# Patient Record
Sex: Female | Born: 1995 | Race: White | Hispanic: No | Marital: Single | Smoking: Never smoker
Health system: Southern US, Community
[De-identification: ages and names within clinical notes are randomized; demographics above are authoritative.]

## PROBLEM LIST (undated history)

## (undated) DIAGNOSIS — G43909 Migraine, unspecified, not intractable, without status migrainosus: Secondary | ICD-10-CM

## (undated) HISTORY — DX: Migraine, unspecified, not intractable, without status migrainosus: G43.909

## (undated) HISTORY — PX: WISDOM TOOTH EXTRACTION: SHX21

---

## 2017-06-30 HISTORY — PX: LUMBAR PUNCTURE: SHX1985

## 2017-12-24 ENCOUNTER — Encounter: Payer: Self-pay | Admitting: Neurology

## 2017-12-24 ENCOUNTER — Ambulatory Visit (INDEPENDENT_AMBULATORY_CARE_PROVIDER_SITE_OTHER): Payer: Managed Care, Other (non HMO) | Admitting: Neurology

## 2017-12-24 ENCOUNTER — Other Ambulatory Visit: Payer: Self-pay | Admitting: Neurology

## 2017-12-24 VITALS — BP 120/80 | HR 70 | Temp 98.5°F | Resp 12

## 2017-12-24 VITALS — BP 113/75 | HR 84 | Ht 61.0 in | Wt 144.0 lb

## 2017-12-24 DIAGNOSIS — H532 Diplopia: Secondary | ICD-10-CM | POA: Diagnosis not present

## 2017-12-24 DIAGNOSIS — R51 Headache with orthostatic component, not elsewhere classified: Secondary | ICD-10-CM

## 2017-12-24 DIAGNOSIS — R519 Headache, unspecified: Secondary | ICD-10-CM

## 2017-12-24 DIAGNOSIS — G43009 Migraine without aura, not intractable, without status migrainosus: Secondary | ICD-10-CM | POA: Insufficient documentation

## 2017-12-24 DIAGNOSIS — H4710 Unspecified papilledema: Secondary | ICD-10-CM

## 2017-12-24 DIAGNOSIS — H5462 Unqualified visual loss, left eye, normal vision right eye: Secondary | ICD-10-CM

## 2017-12-24 DIAGNOSIS — H9190 Unspecified hearing loss, unspecified ear: Secondary | ICD-10-CM

## 2017-12-24 DIAGNOSIS — G8929 Other chronic pain: Secondary | ICD-10-CM

## 2017-12-24 DIAGNOSIS — G43711 Chronic migraine without aura, intractable, with status migrainosus: Secondary | ICD-10-CM | POA: Insufficient documentation

## 2017-12-24 DIAGNOSIS — G43709 Chronic migraine without aura, not intractable, without status migrainosus: Secondary | ICD-10-CM

## 2017-12-24 MED ORDER — ERENUMAB-AOOE 140 MG/ML ~~LOC~~ SOAJ: 140.0000 mg | SUBCUTANEOUS | 11 refills | Status: DC

## 2017-12-24 NOTE — Progress Notes (Signed)
[facility]    Provider:  Dr Lucia Gaskins Referring Provider: Lynden Ang, NP Primary Care Physician:  Lynden Ang, NP   CC:  Intractable headaches  HPI:  Andrea Dunlap is a 22 y.o. female here as a referral from Dr. Maurice March for intractable headaches. She has a PMHx of migraines. Started in the 6th grade. She has pain at the base of her neck unilaterally, light sensitivity and smell sensitivity, they spread unilaterally and cal also spread bilaterally, she feels fatigued and confused, dizzy, nausea, pressure, pounding, throbbing. Movement makes it worse. Can last 5-6 days. Daily headaches. She has 15 migraines days a month. Most are moderately severe and can have several days of severe, needs to sleep and be in a dark room, she can't work. Sleep helps. Ongoing for several years at this severity and frequency. Birth Control helped. Caffeine and ibuprofen helps.  Failed Topamax, Celebrex, Compazine, nortriptyline,. Topamax helped but had side effects to it. No medication overuse. No aura.  Patient has blurry vision especially in the left eye, she has eye pain on the left and eye pain on movement, she reports diplopia.  Headaches can be positionally worse, worse bending over, she can have migraine headaches in the morning, episodes of blurry vision, also pulsating and hearing changes in the ears. No other focal neurologic deficits, associated symptoms, inciting events or modifiable factors.  Meds: Failed Topamax, Celebrex, Compazine, nortriptyline,. Topamax helped but had side effects to it.   Reviewed notes, labs and imaging from outside physicians, which showed:  Reviewed notes from referring physician.  Patient has trouble remembering to take her birth control and so she is getting an implant.  She has regular menstrual cycles.  She is been having migraines and worsening headaches over the last several years she reports this is affecting her vision which can get blurry birth  control pills have helped but she still continues to have daily headaches she has seen a neurologist for this in the past needs a new one.  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  Physical exam was normal.  She was referred here for evaluation of these headaches.  No significant caffeine use, no drug use, no smoking.  Migraines have been worse with her menstruation.  She is seeing [facility] in the past.  Review of Systems: Patient complains of symptoms per HPI as well as the following symptoms: headaches. Pertinent negatives and positives per HPI. All others negative.   Social History   Socioeconomic History  . Marital status: Single    Spouse name: Not on file  . Number of children: Not on file  . Years of education: Not on file  . Highest education level: Not on file  Occupational History  . Not on file  Social Needs  . Financial resource strain: Not on file  . Food insecurity:    Worry: Not on file    Inability: Not on file  . Transportation needs:    Medical: Not on file    Non-medical: Not on file  Tobacco Use  . Smoking status: Never Smoker  . Smokeless tobacco: Never Used  Substance and Sexual Activity  . Alcohol use: Never    Frequency: Never  . Drug use: Not on file  . Sexual activity: Not on file  Lifestyle  . Physical activity:    Days per week: Not on file    Minutes per session: Not on file  . Stress: Not on file  Relationships  . Social  connections:    Talks on phone: Not on file    Gets together: Not on file    Attends religious service: Not on file    Active member of club or organization: Not on file    Attends meetings of clubs or organizations: Not on file    Relationship status: Not on file  . Intimate partner violence:    Fear of current or ex partner: Not on file    Emotionally abused: Not on file    Physically abused: Not on file    Forced sexual activity: Not on file  Other Topics Concern  . Not on file  Social History  Narrative   Averages 3 cups of caffeine daily.    Family History  Problem Relation Age of Onset  . Migraines Mother   . Headache Brother     Past Medical History:  Diagnosis Date  . Migraines     Past Surgical History:  Procedure Laterality Date  . WISDOM TOOTH EXTRACTION      Current Outpatient Medications  Medication Sig Dispense Refill  . Erenumab-aooe (AIMOVIG) 140 MG/ML SOAJ Inject 140 mg into the skin every 30 (thirty) days. 1 pen 11  . norethindrone (MICRONOR,CAMILA,ERRIN) 0.35 MG tablet Take 1 tablet by mouth daily.     No current facility-administered medications for this visit.     Allergies as of 12/24/2017 - Review Complete 12/24/2017  Allergen Reaction Noted  . Nickel  12/24/2017    Vitals: There were no vitals taken for this visit. Last Weight:  Wt Readings from Last 1 Encounters:  12/24/17 144 lb (65.3 kg) (74 %, Z= 0.63)*   * Growth percentiles are based on CDC (Girls, 2-20 Years) data.   Last Height:   Ht Readings from Last 1 Encounters:  12/24/17 5\' 1"  (1.549 m) (10 %, Z= -1.29)*   * Growth percentiles are based on CDC (Girls, 2-20 Years) data.   Physical exam: Exam: Gen: NAD, conversant, well nourised, well groomed                     CV: RRR, no MRG. No Carotid Bruits. No peripheral edema, warm, nontender Eyes: Conjunctivae clear without exudates or hemorrhage  Neuro: Detailed Neurologic Exam  Speech:    Speech is normal; fluent and spontaneous with normal comprehension.  Cognition:    The patient is oriented to person, place, and time;     recent and remote memory intact;     language fluent;     normal attention, concentration,     fund of knowledge Cranial Nerves:    The pupils are equal, round, and reactive to light.  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  Visual fields are full to finger confrontation. Extraocular movements are intact. Trigeminal sensation is intact and the muscles of mastication are  normal. The face is symmetric. The palate elevates in the midline. Hearing intact. Voice is normal. Shoulder shrug is normal. The tongue has normal motion without fasciculations.   Coordination:    Normal finger to nose and heel to shin. Normal rapid alternating movements.   Gait:    Heel-toe and tandem gait are normal.   Motor Observation:    No asymmetry, no atrophy, and no involuntary movements noted. Tone:    Normal muscle tone.    Posture:    Posture is normal. normal erect    Strength:    Strength is V/V in the upper and lower limbs.  Sensation: intact to LT     Reflex Exam:  DTR's:    Deep tendon reflexes in the upper and lower extremities are normal bilaterally.   Toes:    The toes are downgoing bilaterally.   Clonus:    Clonus is absent.       Assessment/Plan: This is a very nice 22 year old female here for chronic intractable headaches ongoing for several years with a daily frequency.  She also reports history of migraine disorders.  Patient reports concerning symptoms of morning headaches, positional headaches, diplopia, vision loss especially in the left eye, hearing changes.  This could be all due to her migraines however exam showed papilledema possibly or optic nerve head edema.  Patient needs a more thorough work-up.  MRI brain due to concerning symptoms of morning headaches, positional headaches,vision changes, hearing changes, optic nerve head edema or papilledema to look for space occupying mass, chiari or intracranial hypertension (pseudotumor).  She also needs an MRI of the orbits given her diplopia, left eye papilledema, pain on eye movement, to evaluate for orbital pseudotumor, or other findings within the globe.  Will check CBC and CMP.  In the meantime patient says she cannot remember to take daily medications, we discussed the new C GRP medications and she would like to try it we will order.  Recommended a lumbar puncture patient declines at this time  we will see with the MRI of the brain and referral to ophthalmology shows.  Orders Placed This Encounter  Procedures  . MR BRAIN W WO CONTRAST  . MR ORBITS W WO CONTRAST  . Ambulatory referral to Ophthalmology   Meds ordered this encounter  Medications  . Erenumab-aooe (AIMOVIG) 140 MG/ML SOAJ    Sig: Inject 140 mg into the skin every 30 (thirty) days.    Dispense:  1 pen    Refill:  11    Patient has copay card   Cc: Lynden AngLane, Elizabeth, NP   Naomie DeanAntonia Abanoub Hanken, MD  [facility] [address] [place], [place] [id number]  Phone [phone] Fax [phone]  MRI brain w/wo contrast, MRI orbits Dr. Dione BoozeGroat   Lumbar puncture

## 2017-12-24 NOTE — Progress Notes (Signed)
error 

## 2017-12-24 NOTE — Patient Instructions (Signed)
MRI brain w/wo contrast with orbits thin cuts F/u 6 weeks Aimovig monthly Dr. Dione Booze Ophthalmology   Idiopathic Intracranial Hypertension Idiopathic intracranial hypertension (IIH) is a condition that increases pressure around the brain. The fluid that surrounds the brain and spinal cord (cerebrospinal fluid, CSF) increases and causes the pressure. Idiopathic means that the cause of this condition is not known. IIH affects the brain and spinal cord (is a neurological disorder). If this condition is not treated, it can cause vision loss or blindness. What increases the risk? You are more likely to develop this condition if:  You are severely overweight (obese).  You are a woman who has not gone through menopause.  You take certain medicines, such as birth control or steroids.  What are the signs or symptoms? Symptoms of IIH include:  Headaches. This is the most common symptom.  Pain in the shoulders or neck.  Nausea and vomiting.  A "rushing water" or pulsing sound within the ears (pulsatile tinnitus).  Double vision.  Blurred vision.  Brief episodes of complete vision loss.  How is this diagnosed? This condition may be diagnosed based on:  Your symptoms.  Your medical history.  CT scan of the brain.  MRI of the brain.  Magnetic resonance venogram (MRV) to check veins in the brain.  Diagnostic lumbar puncture. This is a procedure to remove and examine a sample of cerebrospinal fluid. This procedure can determine whether too much fluid may be causing IIH.  A thorough eye exam to check for swelling or nerve damage in the eyes.  How is this treated? Treatment for this condition depends on your symptoms. The goal of treatment is to decrease the pressure around your brain. Common treatments include:  Medicines to decrease the production of spinal fluid and lower the pressure within your skull.  Medicines to prevent or treat headaches.  Surgery to place drains  (shunts) in your brain to remove excess fluid.  Lumbar puncture to remove excess cerebrospinal fluid.  Follow these instructions at home:  If you are overweight or obese, work with your health care provider to lose weight.  Take over-the-counter and prescription medicines only as told by your health care provider.  Do not drive or use heavy machinery while taking medicines that can make you sleepy.  Keep all follow-up visits as told by your health care provider. This is important. Contact a health care provider if:  You have changes in your vision, such as: ? Double vision. ? Not being able to see colors (color vision). Get help right away if:  You have any of the following symptoms and they get worse or do not get better. ? Headaches. ? Nausea. ? Vomiting. ? Vision changes or difficulty seeing. Summary  Idiopathic intracranial hypertension (IIH) is a condition that increases pressure around the brain. The cause is not known (is idiopathic).  The most common symptom of IIH is headaches.  Treatment may include medicines or surgery to relieve the pressure on your brain. This information is not intended to replace advice given to you by your health care provider. Make sure you discuss any questions you have with your health care provider. Document Released: 08/25/2001 Document Revised: 05/07/2016 Document Reviewed: 05/07/2016 Elsevier Interactive Patient Education  2017 Elsevier Inc.  [place]: Patient drug information [employer] Online here. Copyright 9341133942 [employer]. All rights reserved. (For additional information see "Erenumab: Drug information") Brand Names: [place]  Aimovig  What is this drug used for?   It is used to  prevent migraine headaches.  What do I need to tell my doctor BEFORE I take this drug?   If you have an allergy to this drug or any part of this drug.   If you are allergic to any drugs like this one, any other drugs, foods, or other  substances. Tell your doctor about the allergy and what signs you had, like rash; hives; itching; shortness of breath; wheezing; cough; swelling of face, lips, tongue, or throat; or any other signs.   This drug may interact with other drugs or health problems.   Tell your doctor and pharmacist about all of your drugs (prescription or OTC, natural products, vitamins) and health problems. You must check to make sure that it is safe for you to take this drug with all of your drugs and health problems. Do not start, stop, or change the dose of any drug without checking with your doctor.  What are some things I need to know or do while I take this drug?   Tell all of your health care providers that you take this drug. This includes your doctors, nurses, pharmacists, and dentists.   If you have a latex allergy, talk with your doctor.   Tell your doctor if you are pregnant or plan on getting pregnant. You will need to talk about the benefits and risks of using this drug while you are pregnant.   Tell your doctor if you are breast-feeding. You will need to talk about any risks to your baby.  What are some side effects that I need to call my doctor about right away?   WARNING/CAUTION: Even though it may be rare, some people may have very bad and sometimes deadly side effects when taking a drug. Tell your doctor or get medical help right away if you have any of the following signs or symptoms that may be related to a very bad side effect:   Signs of an allergic reaction, like rash; hives; itching; red, swollen, blistered, or peeling skin with or without fever; wheezing; tightness in the chest or throat; trouble breathing, swallowing, or talking; unusual hoarseness; or swelling of the mouth, face, lips, tongue, or throat.  What are some other side effects of this drug?   All drugs may cause side effects. However, many people have no side effects or only have minor side effects. Call your doctor or get medical  help if any of these side effects or any other side effects bother you or do not go away:   Redness or swelling where the shot is given.   Pain where the shot was given.   Constipation.   These are not all of the side effects that may occur. If you have questions about side effects, call your doctor. Call your doctor for medical advice about side effects.   You may report side effects to your national health agency.  How is this drug best taken?   Use this drug as ordered by your doctor. Read all information given to you. Follow all instructions closely.   It is given as a shot into the fatty part of the skin on the top of the thigh, belly area, or upper arm.   If you will be giving yourself the shot, your doctor or nurse will teach you how to give the shot.   Follow how to use as you have been told by the doctor or read the package insert.   If stored in a refrigerator, let this drug come  to room temperature before using it. Leave it at room temperature for at least 30 minutes. Do not heat this drug.   Protect from heat and sunlight.   Do not shake.   Do not give into skin that is irritated, bruised, red, infected, or scarred.   Do not use if the solution is cloudy, leaking, or has particles.   Do not use if solution changes color.   Throw away after using. Do not use the device more than 1 time.   Throw away needles in a needle/sharp disposal box. Do not reuse needles or other items. When the box is full, follow all local rules for getting rid of it. Talk with a doctor or pharmacist if you have any questions.  What do I do if I miss a dose?   Take a missed dose as soon as you think about it.   After taking a missed dose, start a new schedule based on when the dose is taken.  How do I store and/or throw out this drug?   Store in a refrigerator. Do not freeze.   Store in the carton to protect from light.   Do not use if it has been frozen.   If you drop this drug on a hard surface, do  not use it.   If needed, you may store at room temperature for up to 7 days. Write down the date you take this drug out of the refrigerator. If stored at room temperature and not used within 7 days, throw this drug away.   Do not put this drug back in the refrigerator after it has been stored at room temperature.   Keep all drugs in a safe place. Keep all drugs out of the reach of children and pets.   Throw away unused or expired drugs. Do not flush down a toilet or pour down a drain unless you are told to do so. Check with your pharmacist if you have questions about the best way to throw out drugs. There may be drug take-back programs in your area.  General drug facts   If your symptoms or health problems do not get better or if they become worse, call your doctor.   Do not share your drugs with others and do not take anyone else's drugs.   Keep a list of all your drugs (prescription, natural products, vitamins, OTC) with you. Give this list to your doctor.   Talk with the doctor before starting any new drug, including prescription or OTC, natural products, or vitamins.   Some drugs may have another patient information leaflet. If you have any questions about this drug, please talk with your doctor, nurse, pharmacist, or other health care provider.   If you think there has been an overdose, call your poison control center or get medical care right away. Be ready to tell or show what was taken, how much, and when it happened.

## 2017-12-25 NOTE — Telephone Encounter (Signed)
Pt aware PA will be required for her insurance but she will be able to use the copay card that Dr. Lucia GaskinsAhern gave her. She verbalized appreciation and understanding.

## 2017-12-28 ENCOUNTER — Telehealth: Payer: Self-pay | Admitting: Neurology

## 2017-12-28 NOTE — Telephone Encounter (Signed)
[employer] order sent to [facility]. They obtain the auth and will reach out to the pt to schedule.  °

## 2017-12-30 ENCOUNTER — Encounter: Payer: Self-pay | Admitting: Neurology

## 2018-01-08 ENCOUNTER — Ambulatory Visit
Admission: RE | Admit: 2018-01-08 | Discharge: 2018-01-08 | Disposition: A | Discharge status: Home / Self Care | Payer: Managed Care, Other (non HMO) | Source: Ambulatory Visit | Attending: Neurology | Admitting: Neurology

## 2018-01-08 DIAGNOSIS — G43711 Chronic migraine without aura, intractable, with status migrainosus: Secondary | ICD-10-CM

## 2018-01-08 DIAGNOSIS — G8929 Other chronic pain: Secondary | ICD-10-CM

## 2018-01-08 DIAGNOSIS — H4710 Unspecified papilledema: Secondary | ICD-10-CM

## 2018-01-08 DIAGNOSIS — H9190 Unspecified hearing loss, unspecified ear: Secondary | ICD-10-CM

## 2018-01-08 DIAGNOSIS — H532 Diplopia: Secondary | ICD-10-CM

## 2018-01-08 DIAGNOSIS — R51 Headache with orthostatic component, not elsewhere classified: Secondary | ICD-10-CM

## 2018-01-08 DIAGNOSIS — H5462 Unqualified visual loss, left eye, normal vision right eye: Secondary | ICD-10-CM

## 2018-01-08 DIAGNOSIS — R519 Headache, unspecified: Secondary | ICD-10-CM

## 2018-01-08 MED ORDER — GADOBENATE DIMEGLUMINE 529 MG/ML IV SOLN
14.0000 mL | Freq: Once | INTRAVENOUS | Status: AC | PRN
  Administered 2018-01-08: 14 mL via INTRAVENOUS

## 2018-01-11 ENCOUNTER — Other Ambulatory Visit: Payer: Self-pay | Admitting: Neurology

## 2018-01-11 ENCOUNTER — Telehealth: Payer: Self-pay | Admitting: *Deleted

## 2018-01-11 NOTE — Telephone Encounter (Addendum)
Called pt & informed her that her MRI brain & orbits are normal. She verbalized understanding and appreciation.   ----- Message from Anson FretAntonia B Ahern, MD sent at 01/08/2018  9:11 PM EDT ----- MRI of the brain and MRA of the head normal

## 2018-01-11 NOTE — Telephone Encounter (Signed)
[place] [employer] auth: [id number]: [id number] (exp. 01/05/18 to 04/05/18) patietn had MRI's at [facility] on 01/08/18.

## 2018-01-12 ENCOUNTER — Telehealth: Payer: Self-pay | Admitting: Neurology

## 2018-01-12 ENCOUNTER — Other Ambulatory Visit: Payer: Self-pay | Admitting: Neurology

## 2018-01-12 DIAGNOSIS — G08 Intracranial and intraspinal phlebitis and thrombophlebitis: Secondary | ICD-10-CM

## 2018-01-12 DIAGNOSIS — H4710 Unspecified papilledema: Secondary | ICD-10-CM

## 2018-01-12 DIAGNOSIS — H539 Unspecified visual disturbance: Secondary | ICD-10-CM

## 2018-01-12 DIAGNOSIS — G932 Benign intracranial hypertension: Secondary | ICD-10-CM

## 2018-01-12 NOTE — Telephone Encounter (Signed)
[employer] order sent to [facility]. They will obtain the auth and they will reach out to the pt to schedule.

## 2018-01-12 NOTE — Telephone Encounter (Signed)
Called Andrea Dunlap & informed her that Dr. Lucia GaskinsAhern ordered the LP & CT scan to look at veins in the head to make sure there is no clot. Informed Andrea Dunlap that she will be receiving additional calls within the next few days to schedule. Andrea Dunlap verbalized understanding and appreciation for the call.

## 2018-01-12 NOTE — Telephone Encounter (Signed)
Andrea Dunlap, I have ordered a lumbar puncture and a cat scan to evaluate the veins in her head for any clots. This was explained to her at her ophthalmology appointment yesterday. Pending these results I may prescribe another medication. Please tell her to be expecting calls to schedule. thanks

## 2018-01-22 ENCOUNTER — Ambulatory Visit
Admission: RE | Admit: 2018-01-22 | Discharge: 2018-01-22 | Disposition: A | Discharge status: Home / Self Care | Payer: Managed Care, Other (non HMO) | Source: Ambulatory Visit | Attending: Neurology | Admitting: Neurology

## 2018-01-22 ENCOUNTER — Emergency Department (HOSPITAL_COMMUNITY)
Admission: EM | Admit: 2018-01-22 | Discharge: 2018-01-22 | Disposition: A | Discharge status: Home / Self Care | Payer: Managed Care, Other (non HMO) | Attending: Emergency Medicine | Admitting: Emergency Medicine

## 2018-01-22 ENCOUNTER — Emergency Department (HOSPITAL_COMMUNITY): Payer: Managed Care, Other (non HMO)

## 2018-01-22 ENCOUNTER — Encounter (HOSPITAL_COMMUNITY): Payer: Self-pay | Admitting: Emergency Medicine

## 2018-01-22 ENCOUNTER — Other Ambulatory Visit: Payer: Self-pay

## 2018-01-22 DIAGNOSIS — R55 Syncope and collapse: Secondary | ICD-10-CM

## 2018-01-22 DIAGNOSIS — H4710 Unspecified papilledema: Secondary | ICD-10-CM

## 2018-01-22 DIAGNOSIS — G08 Intracranial and intraspinal phlebitis and thrombophlebitis: Secondary | ICD-10-CM

## 2018-01-22 DIAGNOSIS — Z79899 Other long term (current) drug therapy: Secondary | ICD-10-CM | POA: Diagnosis not present

## 2018-01-22 DIAGNOSIS — R519 Headache, unspecified: Secondary | ICD-10-CM

## 2018-01-22 DIAGNOSIS — R51 Headache: Secondary | ICD-10-CM | POA: Insufficient documentation

## 2018-01-22 DIAGNOSIS — G932 Benign intracranial hypertension: Secondary | ICD-10-CM

## 2018-01-22 DIAGNOSIS — H539 Unspecified visual disturbance: Secondary | ICD-10-CM

## 2018-01-22 LAB — CBC
HCT: 43 % (ref 36.0–46.0)
Hemoglobin: 13.8 g/dL (ref 12.0–15.0)
MCH: 28.9 pg (ref 26.0–34.0)
MCHC: 32.1 g/dL (ref 30.0–36.0)
MCV: 90 fL (ref 78.0–100.0)
PLATELETS: 282 10*3/uL (ref 150–400)
RBC: 4.78 MIL/uL (ref 3.87–5.11)
RDW: 12.1 % (ref 11.5–15.5)
WBC: 6.9 10*3/uL (ref 4.0–10.5)

## 2018-01-22 LAB — URINALYSIS, ROUTINE W REFLEX MICROSCOPIC
BILIRUBIN URINE: NEGATIVE
GLUCOSE, UA: NEGATIVE mg/dL
HGB URINE DIPSTICK: NEGATIVE
KETONES UR: NEGATIVE mg/dL
NITRITE: NEGATIVE
PH: 8 (ref 5.0–8.0)
Protein, ur: NEGATIVE mg/dL
Specific Gravity, Urine: 1.003 — ABNORMAL LOW (ref 1.005–1.030)

## 2018-01-22 LAB — BASIC METABOLIC PANEL
Anion gap: 10 (ref 5–15)
BUN: 7 mg/dL (ref 6–20)
CO2: 27 mmol/L (ref 22–32)
CREATININE: 0.66 mg/dL (ref 0.44–1.00)
Calcium: 9.4 mg/dL (ref 8.9–10.3)
Chloride: 101 mmol/L (ref 98–111)
Glucose, Bld: 110 mg/dL — ABNORMAL HIGH (ref 70–99)
POTASSIUM: 3.8 mmol/L (ref 3.5–5.1)
SODIUM: 138 mmol/L (ref 135–145)

## 2018-01-22 LAB — CBG MONITORING, ED: Glucose-Capillary: 81 mg/dL (ref 70–99)

## 2018-01-22 LAB — I-STAT BETA HCG BLOOD, ED (MC, WL, AP ONLY): I-stat hCG, quantitative: <5 m[IU]/mL (ref <5)

## 2018-01-22 MED ORDER — IOHEXOL 300 MG/ML  SOLN
100.0000 mL | Freq: Once | INTRAMUSCULAR | Status: AC | PRN
  Administered 2018-01-22: 100 mL via INTRAVENOUS

## 2018-01-22 MED ORDER — ACETAMINOPHEN 325 MG PO TABS
650.0000 mg | ORAL_TABLET | Freq: Once | ORAL | Status: AC
  Administered 2018-01-22: 650 mg via ORAL
  Filled 2018-01-22: qty 2

## 2018-01-22 NOTE — ED Provider Notes (Signed)
Patient care assumed from Fayrene HelperBowie Tran, PA-C, at about 1600.  Please see their note for details of H&P.  Briefly, this is a [age] year old female with long history of headaches presenting to the ED for syncope versus seizure while undergoing outpatient LP.  Recent MRI reassuring.  LP opening pressure normal today.  Neuro exam is nonfocal here she feels better.  Plan is to await CTV and EEG results.  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  EEG completed.  Findings reviewed with on-call neurologist Dr. Laurence SlateAroor.  He reviewed the EEG which showed no signs of seizure.  He advised discharge with primary neurologist follow-up to discuss CTV results including possible stenoses which may be artifactual.  Patient reassessed at bedside.  HPI briefly reviewed.  Patient is well-appearing and states her headache has resolved.  She has been ambulatory in the ED to the bathroom without difficulty.  Family at bedside.  All ED findings reviewed.  Return precautions and follow-up plan discussed.  They are comfortable with discharge.  All questions answered.   Cecille PoMacklin, Sunita Demond W, MD 01/22/18 Margretta [id number]    Cathren LaineSteinl, Kevin, MD 01/22/18 [id number]

## 2018-01-22 NOTE — ED Notes (Signed)
EEG at beside, will have CT afterwards

## 2018-01-22 NOTE — ED Triage Notes (Signed)
Patient here after syncopal episode during lumbar puncture for testing related to chronic migraine. Patient reports history of syncope with pain. [facility]. Patient alert, oriented, and in no apparent distress at this time.

## 2018-01-22 NOTE — ED Notes (Signed)
Pt alert and oriented in NAD. Pt verbalized understanding of discharge instructions. 

## 2018-01-22 NOTE — Discharge Instructions (Signed)

## 2018-01-22 NOTE — Procedures (Addendum)
ELECTROENCEPHALOGRAM REPORT   Patient: Andrea Dunlap       Room #: B14C EEG No. ID: [id number] Age: 22 y.o.        Sex: female Referring Physician: Hyacinth MeekerMiller Report Date:  01/22/2018        Interpreting Physician: Thana FarrEYNOLDS, Kamonte Mcmichen  History: Andrea RutterBrianna Champagne is an 22 y.o. female with syncopal episode evaluated to rule out seizure  Medications:  None  Conditions of Recording:  This is a 21 channel routine scalp EEG performed with bipolar and monopolar montages arranged in accordance to the international 10/20 system of electrode placement. One channel was dedicated to EKG recording.  The patient is in the awake state.  Description:  The waking background activity consists of a low voltage, symmetrical, fairly well organized, 10 Hz alpha activity, seen from the parieto-occipital and posterior temporal regions.  Low voltage fast activity, poorly organized, is seen anteriorly and is at times superimposed on more posterior regions.  A mixture of theta and alpha rhythms are seen from the central and temporal regions. The patient does not drowse or sleep. No epileptiform activity is noted.   Hyperventilation was performed and produced a mild to moderate buildup but failed to elicit any abnormalities.  Intermittent photic stimulation was performed but failed to illicit any change in the tracing.    IMPRESSION: This is a normal awake electroencephalogram, with activation procedures. There are no focal lateralizing or epileptiform features.   Comment:  An EEG with the patient sleep deprived to elicit drowse and light sleep may be desirable to further elicit a possible seizure disorder.     Thana FarrLeslie Janani Chamber, MD Neurology [phone] 01/22/2018, 7:05 PM

## 2018-01-22 NOTE — ED Notes (Signed)
PA at bedside,  

## 2018-01-22 NOTE — Progress Notes (Signed)
EEG complete - results pending 

## 2018-01-22 NOTE — ED Notes (Signed)
Patient transported to CT 

## 2018-01-22 NOTE — Consult Note (Addendum)
NEURO HOSPITALIST CONSULT NOTE   Requestig physician: Dr. Hyacinth MeekerMiller   Reason for Consult:Syncope/ seizure like activity   History obtained from:  Patient  And Mother  HPI:                                                                                                                                          Andrea Dunlap is an 22 y.o. female with PMH of migraine with aura who [facility] to [facility] after having a syncopal episode  During a Lumbar puncture at [place] [facility].  Patient and mother state that she was having a LP done to evaluate the cause of the edema in her eyes seen by opthomolgy and had a syncopal episode that the staff also described as a seizure. Mother did not witness the episode, and the patient does not remember what happened. Unable to determine how long the jerking lasted. Denies loss of bowel, bladder or tongue biting. Denies any weakness or numbness and tingling. Mom says that the staff described the episode as a lot of jerking, and her eyes rolled to the back of her head. They had to hold her down to keep the patient from falling off the table. Patient has had 4-5 syncopal episodes previously, but never with the jerking and always with some type of procedure (i.e blood draw, ear piercing). LP was unable to be obtained d/t syncopal episode. Patient has a 13 year History of migraines. She was diagnosed in the 7th grade. Patient has an aura with her migraines that she describes as floating or flashing lights. Some migraines are more intense than others. Her usual migraines last 5-6 days with about 1-2 days of remission, and then they return. Day 1 or Day 2 are usually the worst. She denies diplopia, but endorses photophobia, smell sensitivity, hyperacusis, and sometimes her hair hurts her head.Her usual migraines she rates as 4/10, and she currently has a HA that she rates 2/10. Nothing usually helps her migraines. Her HA's do get worse when bending down, and she  notes some tinnitus. Patient Just started on Aimovig monthly injections for her Migraines. Only received 1 injection at this time, but has still had 5-6 day long migraine episodes with a few days of relief and then a new migraine.  LMP 01-02-18  Past Medical History:  Diagnosis Date  . Migraines     Past Surgical History:  Procedure Laterality Date  . WISDOM TOOTH EXTRACTION      Family History  Problem Relation Age of Onset  . Migraines Mother   . Headache Brother      Social History:  reports that she has never smoked. She has never used smokeless tobacco. She reports that she does not drink alcohol. Her drug history is not on file.  Allergies  Allergen Reactions  . Nickel Rash    MEDICATIONS:                                                                                                                     No current facility-administered medications for this encounter.    Current Outpatient Medications  Medication Sig Dispense Refill  . AIMOVIG 140 MG/ML SOAJ INJECT 140 MG INTO THE SKIN EVERY 30 (THIRTY) DAYS. 1 pen 11  . norethindrone (MICRONOR,CAMILA,ERRIN) 0.35 MG tablet Take 1 tablet by mouth daily.        ROS:                                                                                                                                       History obtained from the patient  General ROS: negative for - chills, fatigue, fever, night sweats, weight gain or weight loss Ophthalmic ROS: negative for - blurry vision, double vision, eye pain or loss of vision Respiratory ROS: negative for - cough, hemoptysis, shortness of breath or wheezing Cardiovascular ROS: negative for - chest pain, dyspnea on exertion, edema or irregular heartbeat Musculoskeletal ROS: negative for - joint swelling or muscular weakness Neurological ROS: as noted in HPI Dermatological ROS: negative for rash and skin lesion changes   Blood pressure 102/60, pulse 72, temperature 97.8 F (36.6 C),  temperature source Oral, resp. rate 16, last menstrual period 01/02/2018, SpO2 100 %.   General Examination:                                                                                                       Physical Exam  HEENT-  Normocephalic, no lesions, without obvious abnormality.  Normal external eye and conjunctiva.   Cardiovascular- , pulses palpable throughout   Lungs-no excessive working breathing.  Saturations within normal limits on RA Extremities- Warm, dry and intact Musculoskeletal-no joint tenderness, deformity or swelling Skin-warm and dry, no hyperpigmentation, vitiligo, or suspicious lesions  Neurological Examination Mental  Status: Alert,person/place/time/event  Speech fluent without evidence of aphasia.  Able to follow commands without difficulty. Cranial Nerves: II:  Visual fields grossly normal,  III,IV, VI: ptosis not present, extra-ocular motions intact bilaterally pupils equal, round, reactive to light and accommodation V,VII: smile symmetric, facial light touch sensation normal bilaterally VIII: hearing normal bilaterally IX,X: uvula rises symmetrically XI: bilateral shoulder shrug XII: midline tongue extension Motor: Right : Upper extremity   5/5    Left:     Upper extremity   5/5  Lower extremity   5/5     Lower extremity   5/5 Tone and bulk:normal tone throughout; no atrophy noted Sensory:  light touch intact throughout, bilaterally Deep Tendon Reflexes: 2+ and symmetric throughout Plantars: Right: downgoing   Left: downgoing Cerebellar: normal finger-to-nose, Gait: deferred   Lab Results: Basic Metabolic Panel: Recent Labs  Lab 01/22/18 1229  NA 138  K 3.8  CL 101  CO2 27  GLUCOSE 110*  BUN 7  CREATININE 0.66  CALCIUM 9.4    CBC: Recent Labs  Lab 01/22/18 1229  WBC 6.9  HGB 13.8  HCT 43.0  MCV 90.0  PLT 282    Cardiac Enzymes: No results for input(s): CKTOTAL, CKMB, CKMBINDEX, TROPONINI in the last 168 hours.  Lipid  Panel: No results for input(s): CHOL, TRIG, HDL, CHOLHDL, VLDL, LDLCALC in the last 168 hours.  Imaging: Dg Fluoro Guided Loc Of Needle/cath Tip For Spinal Inject Lt  Result Date: 01/22/2018 CLINICAL DATA:  Neurological symptoms.  Headaches.  Papilledema. EXAM: DIAGNOSTIC LUMBAR PUNCTURE UNDER FLUOROSCOPIC GUIDANCE FLUOROSCOPY TIME:  10 seconds. PROCEDURE: Informed consent was obtained from the patient prior to the procedure, including potential complications of headache, allergy, and pain. With the patient prone, the lower back was prepped with Betadine. 1% Lidocaine was used for local anesthesia. Lumbar puncture was performed at the L3-4 level using a 20 gauge needle with return of clear CSF with an opening pressure of 15 cm water. The patient briefly was unresponsive with her eyes open. She posterior with her arms and legs. There was no loss of bowel or bladder control. The needle was immediately removed. Fluid was not obtained. After 15 seconds, she regained consciousness. She remained lucid over the lanced 1 hour. IMPRESSION: The episode above is worrisome for a small seizure. These findings were discussed with Dr. Lucia Gaskins. The patient was then transferred to the [facility] Emergency Room by private vehicle. Electronically Signed   By: Jolaine Click M.D.   On: 01/22/2018 12:24   Routine EEG: This is a normal awake electroencephalogram, with activation procedures. There are no focal lateralizing or epileptiform features.  CTV Head:  1. No dural venous sinus thrombosis. 2. 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 3. Unremarkable CT appearance of the brain without evidence of acute intracranial abnormality.  Impression: 22 y.o. female with PMH of migraine with aura who presented to [facility] after having a syncopal episode  During a Lumbar puncture at [employer] [facility].  Routine EEG with no epileptiform features. CTV showed no dural venus sinus thrombosis.  #Convulsive syncope-  EEG   Recommendations: -- CTV --EEG  Valentina Lucks, MSN, NP-C [facility] [phone]  Attending neurologist's note to follow   01/22/2018, 2:48 PM    NEUROHOSPITALIST ADDENDUM Seen and examined the patient today. I have reviewed the contents of history and physical exam as documented by PA/ARNP/Resident and agree with above documentation.  I have discussed and formulated the above plan as documented. Edits to the note have  been made as needed.    Andrea Spinner Shanedra Lave MD [facility] [phone]   If 7pm to 7am, please call on call as listed on AMION.

## 2018-01-22 NOTE — ED Provider Notes (Signed)
[facility] EMERGENCY DEPARTMENT Provider Note   CSN: [id number] Arrival date & time: 01/22/18  1214     History   Chief Complaint Chief Complaint  Patient presents with  . Loss of Consciousness    HPI Andrea Dunlap is a 22 y.o. female.  The history is provided by the patient and a parent. No language interpreter was used.     22 year old female with history of chronic migraine presenting for evaluation of a syncopal episode.  Patient states she has had recurrent headache since seventh grade.  Headache usually appears on a weekly basis and sometimes may last for 4 to 5 days.  Occasionally headache would generate from her left eye in the back of her eye is breath throughout the head.  Endorsed occasional light and sound sensitivity.  She has been evaluated for headache.  Has had a MRI of her brain with and without contrast on July 16 and it was normal.  She had an LP procedure done today and during the procedure patient had a witnessed syncopal/questionable seizure episode that was short lasting she denies any confusion afterward.  Seizure activity started shortly LP needle was introduced.  A pressure of 15 were measured but fluid was not obtained.  It was described as eyes rolling back, follows with jerky movements and pt nearly fell off the exam table.   Patient sent here for further evaluation of her symptoms which may include a CT venogram to rule out dural sinus thrombosis due to having papilledema on exam.  Patient mention she has passed out several times in the past usually with pain or with starting IV.  She denies any prior history of seizure.  Her last menstrual period was July 6.  Currently she report minimal headache and denies any focal numbness or weakness, fever or chills.  Patient report being on birth control pills in the past but none recently.  Past Medical History:  Diagnosis Date  . Migraines     Patient Active Problem List   Diagnosis Date Noted    . Chronic migraine without aura, with intractable migraine, so stated, with status migrainosus 12/24/2017    Past Surgical History:  Procedure Laterality Date  . WISDOM TOOTH EXTRACTION       OB History   None      Home Medications    Prior to Admission medications   Medication Sig Start Date End Date Taking? Authorizing Provider  AIMOVIG 140 MG/ML SOAJ INJECT 140 MG INTO THE SKIN EVERY 30 (THIRTY) DAYS. 12/25/17   Anson Fret, MD  norethindrone (MICRONOR,CAMILA,ERRIN) 0.35 MG tablet Take 1 tablet by mouth daily.    [provider]    Family History Family History  Problem Relation Age of Onset  . Migraines Mother   . Headache Brother     Social History Social History   Tobacco Use  . Smoking status: Never Smoker  . Smokeless tobacco: Never Used  Substance Use Topics  . Alcohol use: Never    Frequency: Never  . Drug use: Not on file     Allergies   Nickel   Review of Systems Review of Systems  All other systems reviewed and are negative.    Physical Exam Updated Vital Signs BP 108/65 (BP Location: Left Arm)   Pulse 75   Temp 97.8 F (36.6 C) (Oral)   Resp 16   LMP 01/02/2018 Comment: ncp  SpO2 100%   Physical Exam  Constitutional: She is oriented to person,  place, and time. She appears well-developed and well-nourished. No distress.  Well-appearing young female laying in bed in no acute discomfort.  HENT:  Head: Atraumatic.  Eyes: Pupils are equal, round, and reactive to light. Conjunctivae and EOM are normal.  Neck: Normal range of motion. Neck supple.  No nuchal rigidity  Cardiovascular: Normal rate and regular rhythm.  Pulmonary/Chest: Effort normal and breath sounds normal.  Abdominal: Soft. There is no tenderness.  Neurological: She is alert and oriented to person, place, and time. She has normal strength. No cranial nerve deficit or sensory deficit. She displays a negative Romberg sign. Coordination normal. GCS eye subscore  is 4. GCS verbal subscore is 5. GCS motor subscore is 6.  No arm drift  Skin: No rash noted.  Psychiatric: She has a normal mood and affect.  Nursing note and vitals reviewed.    ED Treatments / Results  Labs (all labs ordered are listed, but only abnormal results are displayed) Labs Reviewed  BASIC METABOLIC PANEL - Abnormal; Notable for the following components:      Result Value   Glucose, Bld 110 (*)    All other components within normal limits  URINALYSIS, ROUTINE W REFLEX MICROSCOPIC - Abnormal; Notable for the following components:   Color, Urine STRAW (*)    Specific Gravity, Urine 1.003 (*)    Leukocytes, UA TRACE (*)    Bacteria, UA RARE (*)    All other components within normal limits  CBC  CBG MONITORING, ED  I-STAT BETA HCG BLOOD, ED ([facility], [facility], [facility] ONLY)    EKG None   Date: 01/22/2018  Rate: 73  Rhythm: normal sinus rhythm with sinus arrhythmia  QRS Axis: normal  Intervals: normal  ST/T Wave abnormalities: normal  Conduction Disutrbances: none  Narrative Interpretation:   Old EKG Reviewed: No significant changes noted     Radiology Dg Fluoro Guided Loc Of Needle/cath Tip For Spinal Inject Lt  Result Date: 01/22/2018 CLINICAL DATA:  Neurological symptoms.  Headaches.  Papilledema. EXAM: DIAGNOSTIC LUMBAR PUNCTURE UNDER FLUOROSCOPIC GUIDANCE FLUOROSCOPY TIME:  10 seconds. PROCEDURE: Informed consent was obtained from the patient prior to the procedure, including potential complications of headache, allergy, and pain. With the patient prone, the lower back was prepped with Betadine. 1% Lidocaine was used for local anesthesia. Lumbar puncture was performed at the L3-4 level using a 20 gauge needle with return of clear CSF with an opening pressure of 15 cm water. The patient briefly was unresponsive with her eyes open. She posterior with her arms and legs. There was no loss of bowel or bladder control. The needle was immediately removed. Fluid was not obtained. After  15 seconds, she regained consciousness. She remained lucid over the lanced 1 hour. IMPRESSION: The episode above is worrisome for a small seizure. These findings were discussed with Dr. Lucia Gaskins. The patient was then transferred to the [facility] Emergency Room by private vehicle. Electronically Signed   By: Jolaine Click M.D.   On: 01/22/2018 12:24    Procedures Procedures (including critical care time)  Medications Ordered in ED Medications - No data to display   Initial Impression / Assessment and Plan / ED Course  I have reviewed the triage vital signs and the nursing notes.  Pertinent labs & imaging results that were available during my care of the patient were reviewed by me and considered in my medical decision making (see chart for details).     BP 98/62   Pulse 78   Temp  97.8 F (36.6 C) (Oral)   Resp 17   LMP 01/02/2018 Comment: ncp  SpO2 100%    Final Clinical Impressions(s) / ED Diagnoses   Final diagnoses:  None    ED Discharge Orders    None     12:55 PM Patient sent here from IR office for evaluation of syncopal/seizure activity while receiving her lumbar puncture.  No report patient has papilledema and had a seizure-like activity while receiving her LP today.  She had a normal brain MRI as well as normal LP result according to mom.  She will need to obtain head CT venogram to rule out dural sinus thrombosis.  Patient currently resting comfortably and is aware of plan  1:24 PM EKG unremarkable, normal CBG, negative pregnancy test is negative, electrolytes panels are reassuring, normal WBC and normal H&H.  Patient will receive head CT venogram for further evaluation.  She is resting comfortably and does not require any temporizing measure.  4:04 PM Patient is currently receiving an EEG test.  After that she will have her CT head venogram.  Patient signed out to oncoming resident who will follow-up on CT results and will dispo as appropriate.  Care discussed with  DR. Miller.    Fayrene Helperran, Sacha Radloff, PA-C 01/22/18 1608    Eber HongMiller, Brian, MD 01/23/18 2125

## 2018-01-25 ENCOUNTER — Other Ambulatory Visit: Payer: Managed Care, Other (non HMO)

## 2018-01-28 ENCOUNTER — Telehealth: Payer: Self-pay

## 2018-01-28 NOTE — Telephone Encounter (Signed)
PA received for pt's aimovig. Completed PA via covermymeds. Key: [id number]: [id number]. Sent to [facility].  Should have a determination in 1-3 business days.

## 2018-01-28 NOTE — Telephone Encounter (Signed)
Received determination from [facility]. Aimovig 140 mg has been denied. Requirement is to try Ajovy and Emgality.

## 2018-01-28 NOTE — Telephone Encounter (Signed)
Called pt and informed her that Aimovig was denied by insurance however she can continue using the co-pay card at least through December. Pt verbalized understanding and appreciation.

## 2018-01-28 NOTE — Telephone Encounter (Signed)
Please use copay card

## 2018-02-08 ENCOUNTER — Ambulatory Visit (INDEPENDENT_AMBULATORY_CARE_PROVIDER_SITE_OTHER): Payer: Managed Care, Other (non HMO) | Admitting: Neurology

## 2018-02-08 ENCOUNTER — Encounter: Payer: Self-pay | Admitting: Neurology

## 2018-02-08 VITALS — BP 110/69 | HR 83 | Ht 62.0 in | Wt 143.0 lb

## 2018-02-08 DIAGNOSIS — R55 Syncope and collapse: Secondary | ICD-10-CM

## 2018-02-08 DIAGNOSIS — G43009 Migraine without aura, not intractable, without status migrainosus: Secondary | ICD-10-CM | POA: Diagnosis not present

## 2018-02-08 DIAGNOSIS — H4710 Unspecified papilledema: Secondary | ICD-10-CM | POA: Diagnosis not present

## 2018-02-08 MED ORDER — ACETAZOLAMIDE 250 MG PO TABS: 250.0000 mg | ORAL_TABLET | Freq: Two times a day (BID) | ORAL | 11 refills | Status: DC

## 2018-02-08 NOTE — Patient Instructions (Addendum)
1. Fainting is Vasovagal 2. 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 Other options are stenting however wouldn't recommend this as can treat with Acetazolamide (Diamox) or Topiramate 3. Continue to follow with Dr. Dione Booze to keep an eye on vision changes but Acetazolamide (Diamox) will very likely treat the increased pressure due to small veins in the brain   Vasovagal Syncope, Adult Syncope, which is commonly known as fainting or passing out, is a temporary loss of consciousness. It occurs when the blood flow to the brain is reduced. Vasovagal syncope, also called neurocardiogenic syncope, is a fainting spell that happens when blood flow to the brain is reduced because of a sudden drop in heart rate and blood pressure. Vasovagal syncope is usually harmless. However, you can get injured if you fall during a fainting spell. What are the causes? This condition is caused by a drop in heart rate and blood pressure, usually in response to a trigger. Many things and situations can trigger an episode, including:  Pain.  Fear.  The sight of blood. This may occur during medical procedures, such as when blood is being drawn from a vein.  Common activities, such as coughing, swallowing, stretching, or going to the bathroom.  Emotional stress.  Being in a confined space.  Prolonged standing, especially in a warm environment.  Lack of sleep or rest.  Not eating for a long time.  Not drinking enough liquids.  Recent illness.  Drinking alcohol.  Taking drugs that affect blood pressure, such as marijuana, cocaine, opiates, or inhalants.  What are the signs or symptoms? Before a fainting episode, you may:  Feel dizzy or light-headed.  Become pale.  Sense that you are going to faint.  Feel like the room is spinning.  Only see directly ahead (tunnel vision).  Feel sick to your stomach (nauseous).  See spots.  Slowly lose  vision.  Hear ringing in your ears.  Have a headache.  Feel warm and sweaty.  Feel a sensation of pins and needles.  During the fainting spell, you may twitch or make jerky movements. Fainting spells usually last no longer than a few minutes before you wake up. If you get up too quickly before your body can recover, you may faint again. How is this diagnosed? This condition is diagnosed based on your symptoms, your medical history, and a physical exam. Tests may be done to rule out other causes of fainting. Tests may include:  Blood tests.  Heart tests, such as an electrocardiogram (ECG), echocardiogram, or electrophysiology study.  A test to check your response to changes in position (tilt table test).  How is this treated? Usually, treatment is not needed for this condition. Your health care provider may suggest ways to help prevent fainting episodes. These may include:  Drinking additional fluids if you are exposed to a trigger.  Sitting or lying down if you notice signs that an episode is coming.  If your fainting spells continue, your health care provider may recommend that you:  Take medicines to prevent fainting or to help reduce further episodes of fainting.  Do certain exercises.  Wear compression stockings.  Have surgery to place a pacemaker in your body (rare).  Follow these instructions at home:  Learn to identify the signs that an episode is coming.  Sit or lie down at the first sign of a fainting spell. If you sit down, put your head down between your legs. If you lie down, swing  your legs up in the air to increase blood flow to the brain.  Avoid hot tubs and saunas.  Avoid standing for a long time. If you have to stand for a long time, try: ? Crossing your legs. ? Flexing and stretching your leg muscles. ? Squatting. ? Moving your legs. ? Bending over.  Drink enough fluid to keep your urine clear or pale yellow.  Make changes to your diet that your  health care provider recommends. You may be told to: ? Avoid caffeine. ? Eat more salt.  Take over-the-counter and prescription medicines only as told by your health care provider. Contact a health care provider if:  You continue to have fainting spells despite treatment.  You faint more often despite treatment.  You lose consciousness for more than a few minutes.  You faint during or after exercising or after being startled.  You have twitching or jerky movements for longer than a few seconds during a fainting spell.  You have an episode of twitching or jerky movements without fainting. Get help right away if:  A fainting spell leads to an injury or bleeding.  You have new symptoms that occur with the fainting spells, such as: ? Shortness of breath. ? Chest pain. ? Irregular heartbeat.  You twitch or make jerky movements for more than 5 minutes.  You twitch or make jerky movements during more than one fainting spell. This information is not intended to replace advice given to you by your health care provider. Make sure you discuss any questions you have with your health care provider. Document Released: 06/02/2012 Document Revised: 11/28/2015 Document Reviewed: 04/14/2015 Elsevier Interactive Patient Education  2018 Elsevier Inc.   Acetazolamide tablets What is this medicine? ACETAZOLAMIDE (a set a ZOLE a mide) is used to treat glaucoma and some seizure disorders. It may be used to treat edema or swelling from heart failure or from other medicines. This medicine is also used to treat and to prevent altitude or mountain sickness. This medicine may be used for other purposes; ask your health care provider or pharmacist if you have questions. COMMON BRAND NAME(S): Diamox What should I tell my health care provider before I take this medicine? They need to know if you have any of these conditions: -diabetes -kidney disease -liver disease -lung disease -an unusual or  allergic reaction to acetazolamide, sulfa drugs, other medicines, foods, dyes, or preservatives -pregnant or trying to get pregnant -breast-feeding How should I use this medicine? Take this medicine by mouth with a glass of water. Follow the directions on the prescription label. Take this medicine with food if it upsets your stomach. Take your doses at regular intervals. Do not take your medicine more often than directed. Do not stop taking except on your doctor's advice. Talk to your pediatrician regarding the use of this medicine in children. Special care may be needed. Patients over 20 years old may have a stronger reaction and need a smaller dose. Overdosage: If you think you have taken too much of this medicine contact a poison control center or emergency room at once. NOTE: This medicine is only for you. Do not share this medicine with others. What if I miss a dose? If you miss a dose, take it as soon as you can. If it is almost time for your next dose, take only that dose. Do not take double or extra doses. What may interact with this medicine? Do not take this medicine with any of the following medications: -methazolamide  This medicine may also interact with the following medications: -aspirin and aspirin-like medicines -cyclosporine -lithium -medicine for diabetes -methenamine -other diuretics -phenytoin -primidone -quinidine -sodium bicarbonate -stimulant medicines like dextroamphetamine This list may not describe all possible interactions. Give your health care provider a list of all the medicines, herbs, non-prescription drugs, or dietary supplements you use. Also tell them if you smoke, drink alcohol, or use illegal drugs. Some items may interact with your medicine. What should I watch for while using this medicine? Visit your doctor or health care professional for regular checks on your progress. You will need blood work done regularly. If you are diabetic, check your blood  sugar as directed. You may need to be on a special diet while taking this medicine. Ask your doctor. Also, ask how many glasses of fluid you need to drink a day. You must not get dehydrated. You may get drowsy or dizzy. Do not drive, use machinery, or do anything that needs mental alertness until you know how this medicine affects you. Do not stand or sit up quickly, especially if you are an older patient. This reduces the risk of dizzy or fainting spells. This medicine can make you more sensitive to the sun. Keep out of the sun. If you cannot avoid being in the sun, wear protective clothing and use sunscreen. Do not use sun lamps or tanning beds/booths. What side effects may I notice from receiving this medicine? Side effects that you should report to your doctor or health care professional as soon as possible: -allergic reactions like skin rash, itching or hives, swelling of the face, lips, or tongue -breathing problems -confusion, depression -dark urine -fever -numbness, tingling in hands or feet -redness, blistering, peeling or loosening of the skin, including inside the mouth -ringing in the ears -seizures -unusually weak or tired -yellowing of the eyes or skin Side effects that usually do not require medical attention (report to your doctor or health care professional if they continue or are bothersome): -change in taste -diarrhea -headache -loss of appetite -nausea, vomiting -passing urine more often This list may not describe all possible side effects. Call your doctor for medical advice about side effects. You may report side effects to FDA at 1-800-FDA-1088. Where should I keep my medicine? Keep out of the reach of children. Store at room temperature between 20 and 25 degrees C (68 and 77 degrees F). Throw away any unused medicine after the expiration date. NOTE: This sheet is a summary. It may not cover all possible information. If you have questions about this medicine, talk  to your doctor, pharmacist, or health care provider.  2018 Elsevier/Gold Standard (2007-09-08 10:59:40)

## 2018-02-08 NOTE — Progress Notes (Signed)
[facility]    Provider:  Dr Lucia Gaskins Referring Provider: Pediatrics, Sandre Kitty* Primary Care Physician:  Lynden Ang, NP   CC:  Intractable headaches  Interval history 02/08/2018: She has some episodes of passing out, once she was having her hair blow dried and felt overheated, then daith piercing and getting blood drawn and passed out. Opening pressure was normal. 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 Discussed medication and stenting options. Needs to follow closely with Dr. Dione Booze for vision checks. Will start Diamox. She declines any stenting procedures, will start medication management. Passing out is likely vasovagal.  CTV: reviewed images and agree:  IMPRESSION: 1. No dural venous sinus thrombosis. 2. 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 3. Unremarkable CT appearance of the brain without evidence of acute intracranial abnormality.  HPI:  Andrea Dunlap is a 22 y.o. female here as a referral from Dr. Lenore Manner for intractable headaches. She has a PMHx of migraines. Started in the 6th grade. She has pain at the base of her neck unilaterally, light sensitivity and smell sensitivity, they spread unilaterally and cal also spread bilaterally, she feels fatigued and confused, dizzy, nausea, pressure, pounding, throbbing. Movement makes it worse. Can last 5-6 days. Daily headaches. She has 15 migraines days a month. Most are moderately severe and can have several days of severe, needs to sleep and be in a dark room, she can't work. Sleep helps. Ongoing for several years at this severity and frequency. Birth Control helped. Caffeine and ibuprofen helps.  Failed Topamax, Celebrex, Compazine, nortriptyline,. Topamax helped but had side effects to it. No medication overuse. No aura.  Patient has blurry vision especially in the left eye, she has eye pain on the left and eye pain on movement, she reports diplopia.  Headaches can be  positionally worse, worse bending over, she can have migraine headaches in the morning, episodes of blurry vision, also pulsating and hearing changes in the ears. No other focal neurologic deficits, associated symptoms, inciting events or modifiable factors.  Meds: Failed Topamax, Celebrex, Compazine, nortriptyline,. Topamax helped but had side effects to it.   Reviewed notes, labs and imaging from outside physicians, which showed:  Reviewed notes from referring physician.  Patient has trouble remembering to take her birth control and so she is getting an implant.  She has regular menstrual cycles.  She is been having migraines and worsening headaches over the last several years she reports this is affecting her vision which can get blurry birth control pills have helped but she still continues to have daily headaches she has seen a neurologist for this in the past needs a new one.  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  Physical exam was normal.  She was referred here for evaluation of these headaches.  No significant caffeine use, no drug use, no smoking.  Migraines have been worse with her menstruation.  She is seeing [facility] in the past.  Review of Systems: Patient complains of symptoms per HPI as well as the following symptoms: headaches. Pertinent negatives and positives per HPI. All others negative.   Social History   Socioeconomic History  . Marital status: Single    Spouse name: Not on file  . Number of children: Not on file  . Years of education: last year of bachelors degree in progress  . Highest education level: Not on file  Occupational History  . Not on file  Social Needs  . Financial resource strain: Not on file  .  Food insecurity:    Worry: Not on file    Inability: Not on file  . Transportation needs:    Medical: Not on file    Non-medical: Not on file  Tobacco Use  . Smoking status: Never Smoker  . Smokeless tobacco: Never Used  Substance  and Sexual Activity  . Alcohol use: Yes    Frequency: Never    Comment: rare   . Drug use: Never  . Sexual activity: Not on file  Lifestyle  . Physical activity:    Days per week: Not on file    Minutes per session: Not on file  . Stress: Not on file  Relationships  . Social connections:    Talks on phone: Not on file    Gets together: Not on file    Attends religious service: Not on file    Active member of club or organization: Not on file    Attends meetings of clubs or organizations: Not on file    Relationship status: Not on file  . Intimate partner violence:    Fear of current or ex partner: Not on file    Emotionally abused: Not on file    Physically abused: Not on file    Forced sexual activity: Not on file  Other Topics Concern  . Not on file  Social History Narrative   Averages 3 cups of caffeine daily.   Left handed   Lives at home with her mother and brother    Family History  Problem Relation Age of Onset  . Migraines Mother   . Headache Brother     Past Medical History:  Diagnosis Date  . Migraines     Past Surgical History:  Procedure Laterality Date  . LUMBAR PUNCTURE  2019  . WISDOM TOOTH EXTRACTION      Current Outpatient Medications  Medication Sig Dispense Refill  . AIMOVIG 140 MG/ML SOAJ INJECT 140 MG INTO THE SKIN EVERY 30 (THIRTY) DAYS. 1 pen 11  . acetaZOLAMIDE (DIAMOX) 250 MG tablet Take 1 tablet (250 mg total) by mouth 2 (two) times daily. 60 tablet 11   No current facility-administered medications for this visit.     Allergies as of 02/08/2018 - Review Complete 02/08/2018  Allergen Reaction Noted  . Nickel Rash 12/24/2017    Vitals: BP 110/69 (BP Location: Left Arm, Patient Position: Sitting)   Pulse 83   Ht 5\' 2"  (1.575 m)   Wt 143 lb (64.9 kg)   LMP 02/03/2018 (Exact Date)   BMI 26.16 kg/m  Last Weight:  Wt Readings from Last 1 Encounters:  02/08/18 143 lb (64.9 kg)   Last Height:   Ht Readings from Last 1  Encounters:  02/08/18 5\' 2"  (1.575 m)   Physical exam: Exam: Gen: NAD, conversant, well nourised, well groomed                     CV: RRR, no MRG. No Carotid Bruits. No peripheral edema, warm, nontender Eyes: Conjunctivae clear without exudates or hemorrhage  Neuro: Detailed Neurologic Exam  Speech:    Speech is normal; fluent and spontaneous with normal comprehension.  Cognition:    The patient is oriented to person, place, and time;     recent and remote memory intact;     language fluent;     normal attention, concentration,     fund of knowledge Cranial Nerves:    The pupils are equal, round, and reactive to light.  Nasal blurring  of the right eye, papilledema +1 in the left eye.  Visual fields are full to finger confrontation. Extraocular movements are intact. Trigeminal sensation is intact and the muscles of mastication are normal. The face is symmetric. The palate elevates in the midline. Hearing intact. Voice is normal. Shoulder shrug is normal. The tongue has normal motion without fasciculations.   Coordination:    Normal finger to nose and heel to shin. Normal rapid alternating movements.   Gait:    Heel-toe and tandem gait are normal.   Motor Observation:    No asymmetry, no atrophy, and no involuntary movements noted. Tone:    Normal muscle tone.    Posture:    Posture is normal. normal erect    Strength:    Strength is V/V in the upper and lower limbs.      Sensation: intact to LT     Reflex Exam:  DTR's:    Deep tendon reflexes in the upper and lower extremities are normal bilaterally.   Toes:    The toes are downgoing bilaterally.   Clonus:    Clonus is absent.       Assessment/Plan: This is a very nice 22 year old female here for chronic intractable headaches ongoing for several years with a daily frequency.  She also reports history of migraine disorders.  Patient reports concerning symptoms of morning headaches, positional headaches, diplopia,  vision loss especially in the left eye, hearing changes.  Exam showed papilledema possibly or optic nerve head edema confirmed by Ophthalmology.  CTV with sinus stenosis likely cause of eye findings. Stenting is a possibility, for now start Diamox and [facility].   LP opening pressure was normal  Loss of consciousness Vasaovagal Syncope  Continue aimovig for migraines     Meds ordered this encounter  Medications  . acetaZOLAMIDE (DIAMOX) 250 MG tablet    Sig: Take 1 tablet (250 mg total) by mouth 2 (two) times daily.    Dispense:  60 tablet    Refill:  11   Cc: Lynden AngLane, Elizabeth, NP  Discussed: To prevent or relieve headaches, try the following: Cool Compress. Lie down and place a cool compress on your head.  Avoid headache triggers. If certain foods or odors seem to have triggered your migraines in the past, avoid them. A headache diary might help you identify triggers.  Include physical activity in your daily routine. Try a daily walk or other moderate aerobic exercise.  Manage stress. Find healthy ways to cope with the stressors, such as delegating tasks on your to-do list.  Practice relaxation techniques. Try deep breathing, yoga, massage and visualization.  Eat regularly. Eating regularly scheduled meals and maintaining a healthy diet might help prevent headaches. Also, drink plenty of fluids.  Follow a regular sleep schedule. Sleep deprivation might contribute to headaches Consider biofeedback. With this mind-body technique, you learn to control certain bodily functions - such as muscle tension, heart rate and blood pressure - to prevent headaches or reduce headache pain.    Proceed to emergency room if you experience new or worsening symptoms or symptoms do not resolve, if you have new neurologic symptoms or if headache is severe, or for any concerning symptom.   Provided education and documentation from American headache Society toolbox  including articles on: chronic migraine medication overuse headache, chronic migraines, prevention of migraines, behavioral and other nonpharmacologic treatments for headache.  Naomie DeanAntonia Mikal Blasdell, MD  [facility] [address] [place], [place] [id number]  Phone [phone] Fax [phone]   A total of 30 minutes was spent face-to-face with this patient. Over half this time was spent on counseling patient on the  1. Papilledema   2. Vaso-vagal reaction   3. Migraine without aura and without status migrainosus, not intractable   4. Vasovagal syncope     diagnosis and different diagnostic and therapeutic options, counseling and coordination of care, risks ans benefits of management, compliance, or risk factor reduction and education.

## 2018-02-09 DIAGNOSIS — H4710 Unspecified papilledema: Secondary | ICD-10-CM | POA: Insufficient documentation

## 2018-02-09 DIAGNOSIS — R55 Syncope and collapse: Secondary | ICD-10-CM | POA: Insufficient documentation

## 2018-06-10 ENCOUNTER — Encounter: Payer: Self-pay | Admitting: Neurology

## 2018-06-10 ENCOUNTER — Ambulatory Visit (INDEPENDENT_AMBULATORY_CARE_PROVIDER_SITE_OTHER): Payer: Managed Care, Other (non HMO) | Admitting: Neurology

## 2018-06-10 VITALS — BP 106/70 | HR 100 | Ht 62.0 in | Wt 143.0 lb

## 2018-06-10 DIAGNOSIS — H4710 Unspecified papilledema: Secondary | ICD-10-CM

## 2018-06-10 DIAGNOSIS — G932 Benign intracranial hypertension: Secondary | ICD-10-CM

## 2018-06-10 NOTE — Progress Notes (Signed)
[facility]    Provider:  Dr Lucia Gaskins Referring Provider: Pediatrics, Sandre Kitty* Primary Care Physician:  Lynden Ang, NP   CC:  Intractable headaches  Interval history 06/10/2018: baseline Daily headaches and 15 migraines days a month. She has 15 headache days now and 8 migraines. The migraines are moderately severe to severe. So the aimovig has helped. So has the diamox but having side effects. She takes 2 pills at night of the diamox.  She is tired int he morning but she is also eradically sleeping. She doesn't go to sleep regularly some night gets 4 hours of sleep.   Interval history 02/08/2018: She has some episodes of passing out, once she was having her hair blow dried and felt overheated, then daith piercing and getting blood drawn and passed out. Opening pressure was normal. 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 Discussed medication and stenting options. Needs to follow closely with Dr. Dione Booze for vision checks. Will start Diamox. She declines any stenting procedures, will start medication management. Passing out is likely vasovagal.  CTV: reviewed images and agree:  IMPRESSION: 1. No dural venous sinus thrombosis. 2. 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 3. Unremarkable CT appearance of the brain without evidence of acute intracranial abnormality.  HPI:  Andrea Dunlap is a 22 y.o. female here as a referral from Dr. Lenore Manner for intractable headaches. She has a PMHx of migraines. Started in the 6th grade. She has pain at the base of her neck unilaterally, light sensitivity and smell sensitivity, they spread unilaterally and cal also spread bilaterally, she feels fatigued and confused, dizzy, nausea, pressure, pounding, throbbing. Movement makes it worse. Can last 5-6 days. Daily headaches. She has 15 migraines days a month. Most are moderately severe and can have several days of severe, needs to sleep and be in a  dark room, she can't work. Sleep helps. Ongoing for several years at this severity and frequency. Birth Control helped. Caffeine and ibuprofen helps.  Failed Topamax, Celebrex, Compazine, nortriptyline,. Topamax helped but had side effects to it. No medication overuse. No aura.  Patient has blurry vision especially in the left eye, she has eye pain on the left and eye pain on movement, she reports diplopia.  Headaches can be positionally worse, worse bending over, she can have migraine headaches in the morning, episodes of blurry vision, also pulsating and hearing changes in the ears. No other focal neurologic deficits, associated symptoms, inciting events or modifiable factors.  Meds: Failed Topamax, Celebrex, Compazine, nortriptyline,. Topamax helped but had side effects to it.   Reviewed notes, labs and imaging from outside physicians, which showed:  Reviewed notes from referring physician.  Patient has trouble remembering to take her birth control and so she is getting an implant.  She has regular menstrual cycles.  She is been having migraines and worsening headaches over the last several years she reports this is affecting her vision which can get blurry birth control pills have helped but she still continues to have daily headaches she has seen a neurologist for this in the past needs a new one.  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  Physical exam was normal.  She was referred here for evaluation of these headaches.  No significant caffeine use, no drug use, no smoking.  Migraines have been worse with her menstruation.  She is seeing [facility] in the past.  Review of Systems: Patient complains of symptoms per HPI as well as the following symptoms:  headaches. Pertinent negatives and positives per HPI. All others negative.   Social History   Socioeconomic History  . Marital status: Single    Spouse name: Not on file  . Number of children: Not on file  . Years of  education: last year of bachelors degree in progress  . Highest education level: Not on file  Occupational History  . Not on file  Social Needs  . Financial resource strain: Not on file  . Food insecurity:    Worry: Not on file    Inability: Not on file  . Transportation needs:    Medical: Not on file    Non-medical: Not on file  Tobacco Use  . Smoking status: Never Smoker  . Smokeless tobacco: Never Used  Substance and Sexual Activity  . Alcohol use: Yes    Frequency: Never    Comment: rare   . Drug use: Never  . Sexual activity: Not on file  Lifestyle  . Physical activity:    Days per week: Not on file    Minutes per session: Not on file  . Stress: Not on file  Relationships  . Social connections:    Talks on phone: Not on file    Gets together: Not on file    Attends religious service: Not on file    Active member of club or organization: Not on file    Attends meetings of clubs or organizations: Not on file    Relationship status: Not on file  . Intimate partner violence:    Fear of current or ex partner: Not on file    Emotionally abused: Not on file    Physically abused: Not on file    Forced sexual activity: Not on file  Other Topics Concern  . Not on file  Social History Narrative   Averages 1 cup of caffeine daily.   Left handed   Lives at home with her mother and brother    Family History  Problem Relation Age of Onset  . Migraines Mother   . Headache Brother     Past Medical History:  Diagnosis Date  . Migraines     Past Surgical History:  Procedure Laterality Date  . LUMBAR PUNCTURE  2019  . WISDOM TOOTH EXTRACTION      Current Outpatient Medications  Medication Sig Dispense Refill  . acetaZOLAMIDE (DIAMOX) 250 MG tablet Take 1 tablet (250 mg total) by mouth 2 (two) times daily. 60 tablet 11  . AIMOVIG 140 MG/ML SOAJ INJECT 140 MG INTO THE SKIN EVERY 30 (THIRTY) DAYS. 1 pen 11   No current facility-administered medications for this  visit.     Allergies as of 06/10/2018 - Review Complete 06/10/2018  Allergen Reaction Noted  . Nickel Rash 12/24/2017    Vitals: BP 106/70 (BP Location: Right Arm, Patient Position: Sitting)   Pulse 100   Ht 5\' 2"  (1.575 m)   Wt 143 lb (64.9 kg)   LMP 06/08/2018 (Within Days)   BMI 26.16 kg/m  Last Weight:  Wt Readings from Last 1 Encounters:  06/10/18 143 lb (64.9 kg)   Last Height:   Ht Readings from Last 1 Encounters:  06/10/18 5\' 2"  (1.575 m)   Physical exam: Exam: Gen: NAD, conversant, well nourised, well groomed                     CV: RRR, no MRG. No Carotid Bruits. No peripheral edema, warm, nontender Eyes: Conjunctivae clear without exudates or  hemorrhage  Neuro: Detailed Neurologic Exam  Speech:    Speech is normal; fluent and spontaneous with normal comprehension.  Cognition:    The patient is oriented to person, place, and time;     recent and remote memory intact;     language fluent;     normal attention, concentration,     fund of knowledge Cranial Nerves:    The pupils are equal, round, and reactive to light.  Nasal blurring of the right eye, papilledema +1 in the left eye - both improved .  Visual fields are full to finger confrontation. Extraocular movements are intact. Trigeminal sensation is intact and the muscles of mastication are normal. The face is symmetric. The palate elevates in the midline. Hearing intact. Voice is normal. Shoulder shrug is normal. The tongue has normal motion without fasciculations.   Coordination:    Normal finger to nose and heel to shin. Normal rapid alternating movements.   Gait:    Heel-toe and tandem gait are normal.   Motor Observation:    No asymmetry, no atrophy, and no involuntary movements noted. Tone:    Normal muscle tone.    Posture:    Posture is normal. normal erect    Strength:    Strength is V/V in the upper and lower limbs.      Sensation: intact to LT     Reflex Exam:  DTR's:    Deep  tendon reflexes in the upper and lower extremities are normal bilaterally.   Toes:    The toes are downgoing bilaterally.   Clonus:    Clonus is absent.       Assessment/Plan: This is a very nice 22 year old female here for chronic intractable headaches ongoing for several years with a daily frequency improved on diamox.  She also reports history of migraine disorder improved on aimovig.  Patient reports concerning symptoms of morning headaches, positional headaches, diplopia, vision loss especially in the left eye, hearing changes.  Exam showed papilledema possibly or optic nerve head edema confirmed by Ophthalmology.  CTV with sinus stenosis likely cause of eye findings. Stenting is a possibility, for now start Diamox and follow closely with Dr. Dione Booze Ophthalmology.   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 Lie down and place a cool compress on your head.  Avoid headache triggers. If certain foods or odors seem to have triggered your migraines in the past, avoid them. A headache diary might help you identify triggers.  Include physical activity in your daily routine. Try a daily walk or other moderate aerobic exercise.  Manage stress. Find healthy ways to cope with the stressors, such as delegating tasks on your to-do list.  Practice relaxation techniques. Try deep breathing, yoga, massage and visualization.  Eat regularly. Eating regularly scheduled meals and maintaining a healthy diet might help prevent headaches. Also, drink plenty of fluids.  Follow a regular sleep schedule. Sleep deprivation might contribute to headaches Consider biofeedback. With this mind-body technique, you learn to control certain bodily functions - such as muscle tension, heart  rate and blood pressure - to prevent headaches or reduce headache pain.    Proceed to emergency room if you experience new or worsening symptoms or symptoms do not resolve, if you have new neurologic symptoms or if headache is  severe, or for any concerning symptom.   Provided education and documentation from American headache Society toolbox including articles on: chronic migraine medication overuse headache, chronic migraines, prevention of migraines, behavioral and other nonpharmacologic treatments for headache.  Naomie Dean, MD  [facility] [address] [place], [place] [address]  Phone [phone] Fax [phone]   A total of 25 minutes was spent face-to-face with this patient. Over half this time was spent on counseling patient on the  1. Papilledema   2. IIH (idiopathic intracranial hypertension)   3. Coronary sinus stenosis     diagnosis and different diagnostic and therapeutic options, counseling and coordination of care, risks ans benefits of management, compliance, or risk factor reduction and education.

## 2018-06-13 DIAGNOSIS — G932 Benign intracranial hypertension: Secondary | ICD-10-CM | POA: Insufficient documentation

## 2018-06-13 DIAGNOSIS — Q248 Other specified congenital malformations of heart: Secondary | ICD-10-CM | POA: Insufficient documentation

## 2019-03-19 ENCOUNTER — Other Ambulatory Visit: Payer: Self-pay | Admitting: Neurology

## 2019-03-23 ENCOUNTER — Telehealth: Payer: Self-pay

## 2019-03-23 NOTE — Telephone Encounter (Signed)
PA done for Aimovig on cover my meds   Your information has been submitted to [place]. To check for an updated outcome later, reopen this PA request from your dashboard. If [employer] has not responded to your request within 24 hours, contact [place] at [phone]. If you think there may be a problem with your PA request, use our live chat feature at the bottom right

## 2019-03-24 NOTE — Telephone Encounter (Signed)
Received approval of Aimovig 140 mg from [facility]. Approval dates 03/23/2019-03/22/2020. I faxed the approval to the pt's pharmacy. Received a receipt of confirmation.

## 2019-06-12 NOTE — Progress Notes (Deleted)
PATIENT: Andrea RutterBrianna Dunlap DOB: 03/26/1996  REASON FOR VISIT: follow up HISTORY FROM: patient  No chief complaint on file.    HISTORY OF PRESENT ILLNESS: Today 06/12/19 Andrea Dunlap is a 23 y.o. female here today for follow up for IIH. She continues Diamox 250mg  twice daily and Amovig monthly.   HISTORY: (copied from Dr Trevor MaceAhern's note on 06/10/2018)  Interval history 06/10/2018: baseline Daily headaches and 15 migraines days a month. She has 15 headache days now and 8 migraines. The migraines are moderately severe to severe. So the aimovig has helped. So has the diamox but having side effects. She takes 2 pills at night of the diamox.  She is tired int he morning but she is also eradically sleeping. She doesn't go to sleep regularly some night gets 4 hours of sleep.   Interval history 02/08/2018: She has some episodes of passing out, once she was having her hair blow dried and felt overheated, then daith piercing and getting blood drawn and passed out. Opening pressure was normal. 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 Discussed medication and stenting options. Needs to follow closely with Dr. Dione BoozeGroat for vision checks. Will start Diamox. She declines any stenting procedures, will start medication management. Passing out is likely vasovagal.  CTV: reviewed images and agree:  IMPRESSION: 1. No dural venous sinus thrombosis. 2. 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 3. Unremarkable CT appearance of the brain without evidence of acute intracranial abnormality.  HPI:  Andrea Dunlap is a 23 y.o. female here as a referral from Dr. Lenore MannerPediatrics for intractable headaches. She has a PMHx of migraines. Started in the 6th grade. She has pain at the base of her neck unilaterally, light sensitivity and smell sensitivity, they spread unilaterally and cal also spread bilaterally, she feels fatigued and confused, dizzy, nausea, pressure, pounding, throbbing.  Movement makes it worse. Can last 5-6 days. Daily headaches. She has 15 migraines days a month. Most are moderately severe and can have several days of severe, needs to sleep and be in a dark room, she can't work. Sleep helps. Ongoing for several years at this severity and frequency. Birth Control helped. Caffeine and ibuprofen helps.  Failed Topamax, Celebrex, Compazine, nortriptyline,. Topamax helped but had side effects to it. No medication overuse. No aura.  Patient has blurry vision especially in the left eye, she has eye pain on the left and eye pain on movement, she reports diplopia.  Headaches can be positionally worse, worse bending over, she can have migraine headaches in the morning, episodes of blurry vision, also pulsating and hearing changes in the ears. No other focal neurologic deficits, associated symptoms, inciting events or modifiable factors.  Meds: Failed Topamax, Celebrex, Compazine, nortriptyline,. Topamax helped but had side effects to it.   Reviewed notes, labs and imaging from outside physicians, which showed:  Reviewed notes from referring physician.  Patient has trouble remembering to take her birth control and so she is getting an implant.  She has regular menstrual cycles.  She is been having migraines and worsening headaches over the last several years she reports this is affecting her vision which can get blurry birth control pills have helped but she still continues to have daily headaches she has seen a neurologist for this in the past needs a new one.  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  Physical exam was normal.  She was referred here for evaluation of these headaches.  No significant caffeine use, no  drug use, no smoking.  Migraines have been worse with her menstruation.  She is seeing [facility] in the past.   REVIEW OF SYSTEMS: Out of a complete 14 system review of symptoms, the patient complains only of the following symptoms, and all other  reviewed systems are negative.  ALLERGIES: Allergies  Allergen Reactions  . Nickel Rash    HOME MEDICATIONS: Outpatient Medications Prior to Visit  Medication Sig Dispense Refill  . acetaZOLAMIDE (DIAMOX) 250 MG tablet Take 1 tablet (250 mg total) by mouth 2 (two) times daily. 60 tablet 11  . AIMOVIG 140 MG/ML SOAJ INJECT 140 MG INTO THE SKIN EVERY 30 (THIRTY) DAYS. 1 pen 6   No facility-administered medications prior to visit.    PAST MEDICAL HISTORY: Past Medical History:  Diagnosis Date  . Migraines     PAST SURGICAL HISTORY: Past Surgical History:  Procedure Laterality Date  . LUMBAR PUNCTURE  2019  . WISDOM TOOTH EXTRACTION      FAMILY HISTORY: Family History  Problem Relation Age of Onset  . Migraines Mother   . Headache Brother     SOCIAL HISTORY: Social History   Socioeconomic History  . Marital status: Single    Spouse name: Not on file  . Number of children: Not on file  . Years of education: last year of bachelors degree in progress  . Highest education level: Not on file  Occupational History  . Not on file  Tobacco Use  . Smoking status: Never Smoker  . Smokeless tobacco: Never Used  Substance and Sexual Activity  . Alcohol use: Yes    Comment: rare   . Drug use: Never  . Sexual activity: Not on file  Other Topics Concern  . Not on file  Social History Narrative   Averages 1 cup of caffeine daily.   Left handed   Lives at home with her mother and brother   Social Determinants of Health   Financial Resource Strain:   . Difficulty of Paying Living Expenses: Not on file  Food Insecurity:   . Worried About Charity fundraiser in the Last Year: Not on file  . Ran Out of Food in the Last Year: Not on file  Transportation Needs:   . Lack of Transportation (Medical): Not on file  . Lack of Transportation (Non-Medical): Not on file  Physical Activity:   . Days of Exercise per Week: Not on file  . Minutes of Exercise per Session: Not on  file  Stress:   . Feeling of Stress : Not on file  Social Connections:   . Frequency of Communication with Friends and Family: Not on file  . Frequency of Social Gatherings with Friends and Family: Not on file  . Attends Religious Services: Not on file  . Active Member of Clubs or Organizations: Not on file  . Attends Archivist Meetings: Not on file  . Marital Status: Not on file  Intimate Partner Violence:   . Fear of Current or Ex-Partner: Not on file  . Emotionally Abused: Not on file  . Physically Abused: Not on file  . Sexually Abused: Not on file      PHYSICAL EXAM  There were no vitals filed for this visit. There is no height or weight on file to calculate BMI.  Generalized: Well developed, in no acute distress  Cardiology: normal rate and rhythm, no murmur noted Neurological examination  Mentation: Alert oriented to time, place, history taking. Follows all commands  speech and language fluent Cranial nerve II-XII: Pupils were equal round reactive to light. Extraocular movements were full, visual field were full on confrontational test. Facial sensation and strength were normal. Uvula tongue midline. Head turning and shoulder shrug  were normal and symmetric. Motor: The motor testing reveals 5 over 5 strength of all 4 extremities. Good symmetric motor tone is noted throughout.  Sensory: Sensory testing is intact to soft touch on all 4 extremities. No evidence of extinction is noted.  Coordination: Cerebellar testing reveals good finger-nose-finger and heel-to-shin bilaterally.  Gait and station: Gait is normal. Tandem gait is normal. Romberg is negative. No drift is seen.  Reflexes: Deep tendon reflexes are symmetric and normal bilaterally.   DIAGNOSTIC DATA (LABS, IMAGING, TESTING) - I reviewed patient records, labs, notes, testing and imaging myself where available.  No flowsheet data found.   Lab Results  Component Value Date   WBC 6.9 01/22/2018   HGB  13.8 01/22/2018   HCT 43.0 01/22/2018   MCV 90.0 01/22/2018   PLT 282 01/22/2018      Component Value Date/Time   NA 138 01/22/2018 1229   K 3.8 01/22/2018 1229   CL 101 01/22/2018 1229   CO2 27 01/22/2018 1229   GLUCOSE 110 (H) 01/22/2018 1229   BUN 7 01/22/2018 1229   CREATININE 0.66 01/22/2018 1229   CALCIUM 9.4 01/22/2018 1229   GFRNONAA >60 01/22/2018 1229   GFRAA >60 01/22/2018 1229   No results found for: CHOL, HDL, LDLCALC, LDLDIRECT, TRIG, CHOLHDL No results found for: JKDT2I No results found for: VITAMINB12 No results found for: TSH     ASSESSMENT AND PLAN 23 y.o. year old female  has a past medical history of Migraines. here with ***    ICD-10-CM   1. Intracranial hypertension  G93.2        No orders of the defined types were placed in this encounter.    No orders of the defined types were placed in this encounter.     I spent 15 minutes with the patient. 50% of this time was spent counseling and educating patient on plan of care and medications.    Shawnie Dapper, FNP-C 06/12/2019, 3:59 PM [facility] [address], [address] [place], [place] [address] [phone]

## 2019-06-13 ENCOUNTER — Telehealth: Payer: Self-pay

## 2019-06-13 ENCOUNTER — Encounter: Payer: Self-pay | Admitting: Family Medicine

## 2019-06-13 ENCOUNTER — Ambulatory Visit: Payer: Managed Care, Other (non HMO) | Admitting: Neurology

## 2019-06-13 ENCOUNTER — Ambulatory Visit: Payer: Self-pay | Admitting: Family Medicine

## 2019-06-13 DIAGNOSIS — G932 Benign intracranial hypertension: Secondary | ICD-10-CM

## 2019-06-13 DIAGNOSIS — G43009 Migraine without aura, not intractable, without status migrainosus: Secondary | ICD-10-CM

## 2019-06-13 NOTE — Telephone Encounter (Signed)
Patient was a no call/no show for their appointment today.   

## 2019-06-20 ENCOUNTER — Telehealth: Payer: Self-pay | Admitting: Neurology

## 2019-06-20 DIAGNOSIS — Z0289 Encounter for other administrative examinations: Secondary | ICD-10-CM

## 2019-06-20 MED ORDER — ACETAZOLAMIDE 250 MG PO TABS: 250.0000 mg | ORAL_TABLET | Freq: Two times a day (BID) | ORAL | 1 refills | Status: DC

## 2019-06-20 NOTE — Telephone Encounter (Signed)
Pt has rescheduled her 1 year f/u.  Pt is asking for a refill on her acetaZOLAMIDE (DIAMOX) 250 MG tablet  [id number]

## 2019-06-20 NOTE — Addendum Note (Signed)
Addended by: Gildardo Griffes on: 06/20/2019 02:53 PM   Modules accepted: Orders

## 2019-06-20 NOTE — Telephone Encounter (Signed)
Refill sent.

## 2019-07-25 ENCOUNTER — Other Ambulatory Visit: Payer: Self-pay | Admitting: Neurology

## 2019-08-08 ENCOUNTER — Other Ambulatory Visit: Payer: Self-pay | Admitting: Neurology

## 2019-08-16 NOTE — Progress Notes (Addendum)
PATIENT: Andrea Dunlap DOB: December 02, 1995  REASON FOR VISIT: follow up HISTORY FROM: patient  Chief Complaint  Patient presents with  . Follow-up    Yearly f/u. Alone. Rm 2. Patient mentioned that she can no longer afford her Aimovig. She stated that for the first year she had been using Aimovig Access Card.      HISTORY OF PRESENT ILLNESS: Today 08/17/19 Andrea Dunlap is a 24 y.o. female here today for follow up for IIH and migraines. She continues Amovig monthly and Diamox 250mg  twice daily. She is doing well but has had to stop Amovig due to lack of insurance coverage. She was told that Ajovy or Emgality are preferred medications. She is having about 3 headache days per month. These are easily treated with ibuprofen. 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 She knows that increased stress is a trigger.    HISTORY: (copied from Dr [employer] note on 06/10/2018)  Interval history 06/10/2018: baseline Daily headaches and 15 migraines days a month. She has 15 headache days now and 8 migraines. The migraines are moderately severe to severe. So the aimovig has helped. So has the diamox but having side effects. She takes 2 pills at night of the diamox.  She is tired int he morning but she is also eradically sleeping. She doesn't go to sleep regularly some night gets 4 hours of sleep.   Interval history 02/08/2018: She has some episodes of passing out, once she was having her hair blow dried and felt overheated, then daith piercing and getting blood drawn and passed out. Opening pressure was normal. 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 Discussed medication and stenting options. Needs to follow closely with Dr. 04/10/2018 for vision checks. Will start Diamox. She declines any stenting procedures, will start medication management. Passing out is likely vasovagal.  CTV: reviewed images and agree:  IMPRESSION: 1. No dural venous sinus  thrombosis. 2. 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 3. Unremarkable CT appearance of the brain without evidence of acute intracranial abnormality.  HPI:  Andrea Dunlap is a 24 y.o. female here as a referral from Dr. 26 for intractable headaches. She has a PMHx of migraines. Started in the 6th grade. She has pain at the base of her neck unilaterally, light sensitivity and smell sensitivity, they spread unilaterally and cal also spread bilaterally, she feels fatigued and confused, dizzy, nausea, pressure, pounding, throbbing. Movement makes it worse. Can last 5-6 days. Daily headaches. She has 15 migraines days a month. Most are moderately severe and can have several days of severe, needs to sleep and be in a dark room, she can't work. Sleep helps. Ongoing for several years at this severity and frequency. Birth Control helped. Caffeine and ibuprofen helps.  Failed Topamax, Celebrex, Compazine, nortriptyline,. Topamax helped but had side effects to it. No medication overuse. No aura.  Patient has blurry vision especially in the left eye, she has eye pain on the left and eye pain on movement, she reports diplopia.  Headaches can be positionally worse, worse bending over, she can have migraine headaches in the morning, episodes of blurry vision, also pulsating and hearing changes in the ears. No other focal neurologic deficits, associated symptoms, inciting events or modifiable factors.  Meds: Failed Topamax, Celebrex, Compazine, nortriptyline,. Topamax helped but had side effects to it.   Reviewed notes, labs and imaging from outside physicians, which showed:  Reviewed notes from referring physician.  Patient has trouble  remembering to take her birth control and so she is getting an implant.  She has regular menstrual cycles.  She is been having migraines and worsening headaches over the last several years she reports this is affecting her vision which can get blurry birth control  pills have helped but she still continues to have daily headaches she has seen a neurologist for this in the past needs a new one.  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  Physical exam was normal.  She was referred here for evaluation of these headaches.  No significant caffeine use, no drug use, no smoking.  Migraines have been worse with her menstruation.  She is seeing [facility] in the past.   REVIEW OF SYSTEMS: Out of a complete 14 system review of symptoms, the patient complains only of the following symptoms, headaches and all other reviewed systems are negative.  ALLERGIES: Allergies  Allergen Reactions  . Nickel Rash    HOME MEDICATIONS: Outpatient Medications Prior to Visit  Medication Sig Dispense Refill  . acetaZOLAMIDE (DIAMOX) 250 MG tablet TAKE 1 TABLET BY MOUTH TWICE A DAY 60 tablet 0  . AIMOVIG 140 MG/ML SOAJ INJECT 140 MG INTO THE SKIN EVERY 30 (THIRTY) DAYS. (Patient not taking: Reported on 08/17/2019) 1 pen 6   No facility-administered medications prior to visit.    PAST MEDICAL HISTORY: Past Medical History:  Diagnosis Date  . Migraines     PAST SURGICAL HISTORY: Past Surgical History:  Procedure Laterality Date  . LUMBAR PUNCTURE  2019  . WISDOM TOOTH EXTRACTION      FAMILY HISTORY: Family History  Problem Relation Age of Onset  . Migraines Mother   . Headache Brother     SOCIAL HISTORY: Social History   Socioeconomic History  . Marital status: Single    Spouse name: Not on file  . Number of children: Not on file  . Years of education: last year of bachelors degree in progress  . Highest education level: Not on file  Occupational History  . Not on file  Tobacco Use  . Smoking status: Never Smoker  . Smokeless tobacco: Never Used  Substance and Sexual Activity  . Alcohol use: Yes    Comment: rare   . Drug use: Never  . Sexual activity: Not on file  Other Topics Concern  . Not on file  Social History Narrative    Averages 1 cup of caffeine daily.   Left handed   Lives at home with her mother and brother   Social Determinants of Health   Financial Resource Strain:   . Difficulty of Paying Living Expenses: Not on file  Food Insecurity:   . Worried About Programme researcher, broadcasting/film/video in the Last Year: Not on file  . Ran Out of Food in the Last Year: Not on file  Transportation Needs:   . Lack of Transportation (Medical): Not on file  . Lack of Transportation (Non-Medical): Not on file  Physical Activity:   . Days of Exercise per Week: Not on file  . Minutes of Exercise per Session: Not on file  Stress:   . Feeling of Stress : Not on file  Social Connections:   . Frequency of Communication with Friends and Family: Not on file  . Frequency of Social Gatherings with Friends and Family: Not on file  . Attends Religious Services: Not on file  . Active Member of Clubs or Organizations: Not on file  . Attends Banker Meetings:  Not on file  . Marital Status: Not on file  Intimate Partner Violence:   . Fear of Current or Ex-Partner: Not on file  . Emotionally Abused: Not on file  . Physically Abused: Not on file  . Sexually Abused: Not on file      PHYSICAL EXAM  Vitals:   08/17/19 0749  BP: 104/69  Pulse: 76  Temp: (!) 97.3 F (36.3 C)  TempSrc: Oral  Weight: 163 lb 9.6 oz (74.2 kg)  Height: 5\' 2"  (1.575 m)   Body mass index is 29.92 kg/m.  Generalized: Well developed, in no acute distress  Cardiology: normal rate and rhythm, no murmur noted Neurological examination  Mentation: Alert oriented to time, place, history taking. Follows all commands speech and language fluent Cranial nerve II-XII: Pupils were equal round reactive to light. Extraocular movements were full, visual field were full  Motor: The motor testing reveals 5 over 5 strength of all 4 extremities. Good symmetric motor tone is noted throughout.  Sensory: Sensory testing is intact to soft touch on all 4  extremities. No evidence of extinction is noted.  Coordination: Cerebellar testing reveals good finger-nose-finger and heel-to-shin bilaterally.  Gait and station: Gait is normal.   DIAGNOSTIC DATA (LABS, IMAGING, TESTING) - I reviewed patient records, labs, notes, testing and imaging myself where available.  No flowsheet data found.   Lab Results  Component Value Date   WBC 6.9 01/22/2018   HGB 13.8 01/22/2018   HCT 43.0 01/22/2018   MCV 90.0 01/22/2018   PLT 282 01/22/2018      Component Value Date/Time   NA 138 01/22/2018 1229   K 3.8 01/22/2018 1229   CL 101 01/22/2018 1229   CO2 27 01/22/2018 1229   GLUCOSE 110 (H) 01/22/2018 1229   BUN 7 01/22/2018 1229   CREATININE 0.66 01/22/2018 1229   CALCIUM 9.4 01/22/2018 1229   GFRNONAA >60 01/22/2018 1229   GFRAA >60 01/22/2018 1229   No results found for: CHOL, HDL, LDLCALC, LDLDIRECT, TRIG, CHOLHDL No results found for: 01/24/2018 No results found for: VITAMINB12 No results found for: TSH     ASSESSMENT AND PLAN 24 y.o. year old female  has a past medical history of Migraines. here with     ICD-10-CM   1. Intracranial hypertension  G93.2   2. Migraine without aura and without status migrainosus, not intractable  G43.009     Tinlee is doing very well dispite having to stop Amovig several months ago. We will start Ajovy monthly as this is preferred on her insurance. She will continue Diamox 250mg  twice daily. She will continue to work on healthy lifestyle habits and stay well hydrated. Advised to monitor stress levels. She will follow up in 1 year, sooner if needed.    No orders of the defined types were placed in this encounter.    Meds ordered this encounter  Medications  . Fremanezumab-vfrm (AJOVY) 225 MG/1.5ML SOAJ    Sig: Inject 225 mg into the skin every 30 (thirty) days.    Dispense:  1 pen    Refill:  11    Order Specific Question:   Supervising Provider    Answer:   Colin Mulders  .  acetaZOLAMIDE (DIAMOX) 250 MG tablet    Sig: Take 1 tablet (250 mg total) by mouth 2 (two) times daily.    Dispense:  60 tablet    Refill:  11    Order Specific Question:   Supervising Provider  AnswerMelvenia Beam [[id number]]      I spent 20 minutes with the patient. 50% of this time was spent counseling and educating patient on plan of care and medications.    Debbora Presto, FNP-C 08/17/2019, 8:19 AM [facility] [address] [place], [place] [address] [phone]  Made any corrections needed, and agree with history, physical, neuro exam,assessment and plan as stated.     Sarina Ill, MD [facility]

## 2019-08-17 ENCOUNTER — Ambulatory Visit: Payer: BC Managed Care – PPO | Admitting: Family Medicine

## 2019-08-17 ENCOUNTER — Other Ambulatory Visit: Payer: Self-pay

## 2019-08-17 ENCOUNTER — Encounter: Payer: Self-pay | Admitting: Family Medicine

## 2019-08-17 VITALS — BP 104/69 | HR 76 | Temp 97.3°F | Ht 62.0 in | Wt 163.6 lb

## 2019-08-17 DIAGNOSIS — G932 Benign intracranial hypertension: Secondary | ICD-10-CM

## 2019-08-17 DIAGNOSIS — G43009 Migraine without aura, not intractable, without status migrainosus: Secondary | ICD-10-CM

## 2019-08-17 MED ORDER — ACETAZOLAMIDE 250 MG PO TABS: 250.0000 mg | ORAL_TABLET | Freq: Two times a day (BID) | ORAL | 11 refills | Status: DC

## 2019-08-17 MED ORDER — AJOVY 225 MG/1.5ML ~~LOC~~ SOAJ
225.0000 mg | SUBCUTANEOUS | 11 refills | Status: DC
Start: 2019-08-17 — End: 2020-08-16

## 2019-08-17 NOTE — Patient Instructions (Signed)
  We will start Ajovy every 30 days. Continue Diamox 250mg  twice daily   Make sure to stay well hydrated and manage stress levels.   Follow up in 1 year, sooner if needed   Idiopathic Intracranial Hypertension  Idiopathic intracranial hypertension (IIH) is a condition that increases pressure around the brain. The fluid that surrounds the brain and spinal cord (cerebrospinal fluid, CSF) increases and causes the pressure. Idiopathic means that the cause of this condition is not known. IIH affects the brain and spinal cord (is a neurological disorder). If this condition is not treated, it can cause vision loss or blindness. What increases the risk? You are more likely to develop this condition if:  You are severely overweight (obese).  You are a woman who has not gone through menopause.  You take certain medicines, such as birth control or steroids. What are the signs or symptoms? Symptoms of IIH include:  Headaches. This is the most common symptom.  Pain in the shoulders or neck.  Nausea and vomiting.  A "rushing water" or pulsing sound within the ears (pulsatile tinnitus).  Double vision.  Blurred vision.  Brief episodes of complete vision loss. How is this diagnosed? This condition may be diagnosed based on:  Your symptoms.  Your medical history.  CT scan of the brain.  MRI of the brain.  Magnetic resonance venogram (MRV) to check veins in the brain.  Diagnostic lumbar puncture. This is a procedure to remove and examine a sample of cerebrospinal fluid. This procedure can determine whether too much fluid may be causing IIH.  A thorough eye exam to check for swelling or nerve damage in the eyes. How is this treated? Treatment for this condition depends on your symptoms. The goal of treatment is to decrease the pressure around your brain. Common treatments include:  Medicines to decrease the production of spinal fluid and lower the pressure within your  skull.  Medicines to prevent or treat headaches.  Surgery to place drains (shunts) in your brain to remove excess fluid.  Lumbar puncture to remove excess cerebrospinal fluid. Follow these instructions at home:  If you are overweight or obese, work with your health care provider to lose weight.  Take over-the-counter and prescription medicines only as told by your health care provider.  Do not drive or use heavy machinery while taking medicines that can make you sleepy.  Keep all follow-up visits as told by your health care provider. This is important. Contact a health care provider if:  You have changes in your vision, such as: ? Double vision. ? Not being able to see colors (color vision). Get help right away if:  You have any of the following symptoms and they get worse or do not get better. ? Headaches. ? Nausea. ? Vomiting. ? Vision changes or difficulty seeing. Summary  Idiopathic intracranial hypertension (IIH) is a condition that increases pressure around the brain. The cause is not known (is idiopathic).  The most common symptom of IIH is headaches.  Treatment may include medicines or surgery to relieve the pressure on your brain. This information is not intended to replace advice given to you by your health care provider. Make sure you discuss any questions you have with your health care provider. Document Revised: 05/29/2017 Document Reviewed: 05/07/2016 Elsevier Patient Education  2020 13/01/2016.

## 2019-08-22 ENCOUNTER — Telehealth: Payer: Self-pay

## 2019-08-22 NOTE — Telephone Encounter (Deleted)
Pending approval for Ajovy 225 mg Key: [facility] Rx #: [id number] ICD 10

## 2019-08-22 NOTE — Telephone Encounter (Signed)
Andrea Dunlap has been approved through 11-19-2019. A copy of the approval letter has been faxed to the patient's pharmacy. Confirmation fax has been received.

## 2019-08-22 NOTE — Telephone Encounter (Signed)
Pending approval for Ajovy 225 mg Key: [facility] Rx #: [id number] ICD 10 code: G43.009 PA Case ID: [id number]  Your information has been submitted to [employer]. To check for an updated outcome later, reopen this PA request from your dashboard.  If [employer] has not responded to your request within 24 hours, contact [employer] at [phone]. If you think there may be a problem with your PA request, use our live chat feature at the bottom right.

## 2019-11-16 IMAGING — XA DG FLUORO GUIDE LUMBAR PUNCTURE
1 series · 1 of 1 positions shown · non-contrast
Comparison: none

CLINICAL DATA: Neurological symptoms.  Headaches.  Papilledema.

[Series 1: ortho standard · 1 of 1 slices shown]
[im 1/1]
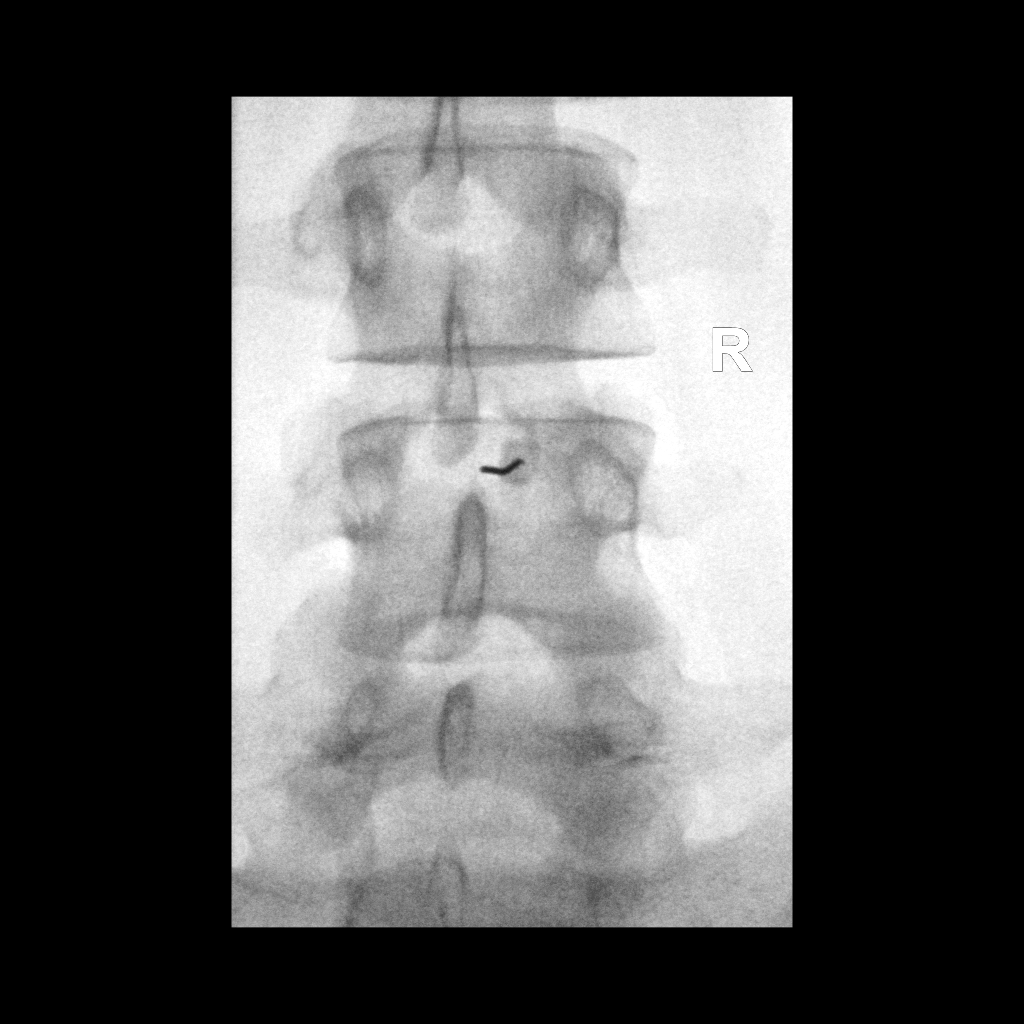

[1 of 1 positions shown; findings below may reference images not displayed]

EXAM:
DIAGNOSTIC LUMBAR PUNCTURE UNDER FLUOROSCOPIC GUIDANCE

FLUOROSCOPY TIME:  10 seconds.

PROCEDURE:
Informed consent was obtained from the patient prior to the
procedure, including potential complications of headache, allergy,
and pain. With the patient prone, the lower back was prepped with
Betadine. 1% Lidocaine was used for local anesthesia. Lumbar
puncture was performed at the L3-4 level using a 20 gauge needle
with return of clear CSF with an opening pressure of 15 cm water.
The patient briefly was unresponsive with her eyes open. She
posterior with her arms and legs. There was no loss of bowel or
bladder control. The needle was immediately removed. Fluid was not
obtained. After 15 seconds, she regained consciousness. She remained
lucid over the lanced 1 hour.
IMPRESSION: The episode above is worrisome for a small seizure. These findings
were discussed with Dr. Muhyetin. The patient was then transferred to
[REDACTED] by private vehicle.

## 2019-11-23 ENCOUNTER — Telehealth: Payer: Self-pay

## 2019-11-23 NOTE — Telephone Encounter (Signed)
A Pa for ajovy has been sent via CMM Status pending  Key: [id number]

## 2020-03-22 ENCOUNTER — Other Ambulatory Visit: Payer: Self-pay | Admitting: *Deleted

## 2020-03-27 ENCOUNTER — Telehealth: Payer: Self-pay | Admitting: Family Medicine

## 2020-03-27 ENCOUNTER — Encounter: Payer: Self-pay | Admitting: Family Medicine

## 2020-03-27 MED ORDER — ONDANSETRON HCL 4 MG PO TABS: 4.0000 mg | ORAL_TABLET | Freq: Three times a day (TID) | ORAL | 0 refills | Status: DC | PRN

## 2020-03-27 MED ORDER — RIZATRIPTAN BENZOATE 10 MG PO TBDP: 10.0000 mg | ORAL_TABLET | ORAL | 11 refills | Status: DC | PRN

## 2020-03-27 NOTE — Telephone Encounter (Signed)
Pt called, would like to discuss infusion for migraines. Have had a migraine for 6 days and ibuprofen did not work. Would like a call from the nurse.

## 2020-03-27 NOTE — Telephone Encounter (Signed)
LVM  Pt spoke to NP today

## 2020-04-03 ENCOUNTER — Telehealth: Payer: Self-pay

## 2020-04-03 NOTE — Telephone Encounter (Signed)
Received PA request for ajovy from [facility]. Completed PA via CMM. KeyMalachi Dunlap - PA Case ID: [id number]. Sent to [facility] [employer].

## 2020-04-09 NOTE — Telephone Encounter (Signed)
PA for ajovy was approved by [facility] [employer]. "As long as you remain covered by the [facility] [employer] and there are no changes to your plan benefits, this request is approved for the following time period: 04/03/2020 - 04/03/2021"

## 2020-08-16 ENCOUNTER — Encounter: Payer: Self-pay | Admitting: Family Medicine

## 2020-08-16 ENCOUNTER — Ambulatory Visit (INDEPENDENT_AMBULATORY_CARE_PROVIDER_SITE_OTHER): Payer: BC Managed Care – PPO | Admitting: Family Medicine

## 2020-08-16 VITALS — BP 113/72 | HR 75 | Ht 62.0 in | Wt 158.0 lb

## 2020-08-16 DIAGNOSIS — G932 Benign intracranial hypertension: Secondary | ICD-10-CM

## 2020-08-16 DIAGNOSIS — G43009 Migraine without aura, not intractable, without status migrainosus: Secondary | ICD-10-CM | POA: Diagnosis not present

## 2020-08-16 MED ORDER — AJOVY 225 MG/1.5ML ~~LOC~~ SOAJ: 225.0000 mg | SUBCUTANEOUS | 3 refills | Status: AC

## 2020-08-16 MED ORDER — RIZATRIPTAN BENZOATE 10 MG PO TBDP: 10.0000 mg | ORAL_TABLET | ORAL | 11 refills | Status: AC | PRN

## 2020-08-16 MED ORDER — ONDANSETRON HCL 4 MG PO TABS: 4.0000 mg | ORAL_TABLET | Freq: Three times a day (TID) | ORAL | 0 refills | Status: AC | PRN

## 2020-08-16 MED ORDER — ACETAZOLAMIDE 250 MG PO TABS: 250.0000 mg | ORAL_TABLET | Freq: Two times a day (BID) | ORAL | 3 refills | Status: AC

## 2020-08-16 NOTE — Patient Instructions (Signed)
Below is our plan:  We will continue acetazolamide and Ajovy for headache management and rizatriptan and ondansetron for abortive therapy.   Please make sure you are staying well hydrated. I recommend 50-60 ounces daily. Well balanced diet and regular exercise encouraged. Consistent sleep schedule with 6-8 hours recommended.   Please continue follow up with care team as directed.   Follow up with me in 1 year   You may receive a survey regarding today's visit. I encourage you to leave honest feed back as I do use this information to improve patient care. Thank you for seeing me today!     Migraine Headache A migraine headache is a very strong throbbing pain on one side or both sides of your head. This type of headache can also cause other symptoms. It can last from 4 hours to 3 days. Talk with your doctor about what things may bring on (trigger) this condition. What are the causes? The exact cause of this condition is not known. This condition may be triggered or caused by:  Drinking alcohol.  Smoking.  Taking medicines, such as: ? Medicine used to treat chest pain (nitroglycerin). ? Birth control pills. ? Estrogen. ? Some blood pressure medicines.  Eating or drinking certain products.  Doing physical activity. Other things that may trigger a migraine headache include:  Having a menstrual period.  Pregnancy.  Hunger.  Stress.  Not getting enough sleep or getting too much sleep.  Weather changes.  Tiredness (fatigue). What increases the risk?  Being 8-16 years old.  Being female.  Having a family history of migraine headaches.  Being Caucasian.  Having depression or anxiety.  Being very overweight. What are the signs or symptoms?  A throbbing pain. This pain may: ? Happen in any area of the head, such as on one side or both sides. ? Make it hard to do daily activities. ? Get worse with physical activity. ? Get worse around bright lights or loud  noises.  Other symptoms may include: ? Feeling sick to your stomach (nauseous). ? Vomiting. ? Dizziness. ? Being sensitive to bright lights, loud noises, or smells.  Before you get a migraine headache, you may get warning signs (an aura). An aura may include: ? Seeing flashing lights or having blind spots. ? Seeing bright spots, halos, or zigzag lines. ? Having tunnel vision or blurred vision. ? Having numbness or a tingling feeling. ? Having trouble talking. ? Having weak muscles.  Some people have symptoms after a migraine headache (postdromal phase), such as: ? Tiredness. ? Trouble thinking (concentrating). How is this treated?  Taking medicines that: ? Relieve pain. ? Relieve the feeling of being sick to your stomach. ? Prevent migraine headaches.  Treatment may also include: ? Having acupuncture. ? Avoiding foods that bring on migraine headaches. ? Learning ways to control your body functions (biofeedback). ? Therapy to help you know and deal with negative thoughts (cognitive behavioral therapy). Follow these instructions at home: Medicines  Take over-the-counter and prescription medicines only as told by your doctor.  Ask your doctor if the medicine prescribed to you: ? Requires you to avoid driving or using heavy machinery. ? Can cause trouble pooping (constipation). You may need to take these steps to prevent or treat trouble pooping:  Drink enough fluid to keep your pee (urine) pale yellow.  Take over-the-counter or prescription medicines.  Eat foods that are high in fiber. These include beans, whole grains, and fresh fruits and vegetables.  Limit foods  that are high in fat and sugar. These include fried or sweet foods. Lifestyle  Do not drink alcohol.  Do not use any products that contain nicotine or tobacco, such as cigarettes, e-cigarettes, and chewing tobacco. If you need help quitting, ask your doctor.  Get at least 8 hours of sleep every  night.  Limit and deal with stress. General instructions  Keep a journal to find out what may bring on your migraine headaches. For example, write down: ? What you eat and drink. ? How much sleep you get. ? Any change in what you eat or drink. ? Any change in your medicines.  If you have a migraine headache: ? Avoid things that make your symptoms worse, such as bright lights. ? It may help to lie down in a dark, quiet room. ? Do not drive or use heavy machinery. ? Ask your doctor what activities are safe for you.  Keep all follow-up visits as told by your doctor. This is important.      Contact a doctor if:  You get a migraine headache that is different or worse than others you have had.  You have more than 15 headache days in one month. Get help right away if:  Your migraine headache gets very bad.  Your migraine headache lasts longer than 72 hours.  You have a fever.  You have a stiff neck.  You have trouble seeing.  Your muscles feel weak or like you cannot control them.  You start to lose your balance a lot.  You start to have trouble walking.  You pass out (faint).  You have a seizure. Summary  A migraine headache is a very strong throbbing pain on one side or both sides of your head. These headaches can also cause other symptoms.  This condition may be treated with medicines and changes to your lifestyle.  Keep a journal to find out what may bring on your migraine headaches.  Contact a doctor if you get a migraine headache that is different or worse than others you have had.  Contact your doctor if you have more than 15 headache days in a month. This information is not intended to replace advice given to you by your health care provider. Make sure you discuss any questions you have with your health care provider. Document Revised: 10/08/2018 Document Reviewed: 07/29/2018 Elsevier Patient Education  2021 [employer] Inc[employer]   Idiopathic Intracranial  Hypertension  Idiopathic intracranial hypertension (IIH) is a condition that increases pressure around the brain. The fluid that surrounds the brain and spinal cord (cerebrospinal fluid, or CSF) increases and causes the pressure. Idiopathic means that the cause of this condition is not known. IIH affects the brain and spinal cord (neurological disorder). If this condition is not treated, it can cause vision loss or blindness. What are the causes? The cause of this condition is not known. What increases the risk? The following factors may make you more likely to develop this condition:  Being very overweight (obese).  Being a female between the ages of 79 and 40 years old, who has not gone through menopause.  Taking certain medicines, such as birth control or steroids. What are the signs or symptoms? Symptoms of this condition include:  Headaches. This is the most common symptom.  Brief episodes of total blindness.  Double vision, blurred vision, or poor side (peripheral) vision.  Pain in the shoulders or neck.  Nausea and vomiting.  A sound like rushing water or a pulsing  sound within the ears (pulsatile tinnitus), or ringing in the ears. How is this diagnosed? This condition may be diagnosed based on:  Your symptoms and medical history.  Imaging tests of the brain, such as: ? CT scan. ? MRI. ? Magnetic resonance venogram (MRV) to check the veins.  Diagnostic lumbar puncture. This is a procedure to remove and examine a sample of cerebrospinal fluid. This procedure can determine whether too much fluid may be causing IIH.  A thorough eye exam to check for swelling or nerve damage in the eyes. How is this treated? Treatment for this condition depends on the symptoms. The goal of treatment is to decrease the pressure around your brain. Common treatments include:  Weight loss through healthy eating, salt restriction, and exercise, if you are overweight.  Medicines to decrease  the production of spinal fluid and lower the pressure within your skull.  Medicines to prevent or treat headaches. Other treatments may include:  Surgery to place drains (shunts) in your brain for removing excess fluid.  Lumbar puncture to remove excess cerebrospinal fluid. Follow these instructions at home:  If you are overweight or obese, work with your health care provider to lose weight.  Take over-the-counter and prescription medicines only as told by your health care provider.  Ask your health care provider if the medicine prescribed to you requires you to avoid driving or using machinery.  Do not use any products that contain nicotine or tobacco, such as cigarettes, e-cigarettes, and chewing tobacco. If you need help quitting, ask your health care provider.  Keep all follow-up visits as told by your health care provider. This is important. Contact a health care provider if: You have changes in your vision, such as:  Double vision.  Blurred vision.  Poor peripheral vision. Get help right away if: You have any of the following symptoms and they get worse or do not get better:  Headaches.  Nausea.  Vomiting.  Sudden trouble seeing. Summary  Idiopathic intracranial hypertension (IIH) is a condition that increases pressure around the brain. The cause is not known (is idiopathic).  The most common symptom of IIH is headaches. Vision changes, pain in the shoulders or neck, nausea, and vomiting may also occur.  Treatment for this condition depends on your symptoms. The goal of treatment is to decrease the pressure around your brain.  If you are overweight or obese, work with your health care provider to lose weight.  Take over-the-counter and prescription medicines only as told by your health care provider. This information is not intended to replace advice given to you by your health care provider. Make sure you discuss any questions you have with your health care  provider. Document Revised: 05/28/2019 Document Reviewed: 05/28/2019 Elsevier Patient Education  2021 [employer].

## 2020-08-16 NOTE — Progress Notes (Addendum)
PATIENT: Andrea Dunlap DOB: [birth year]  REASON FOR VISIT: follow up HISTORY FROM: patient  Chief Complaint  Patient presents with  . Follow-up    Rm 1 alone Pt is well, migraines have been manageable, not as sever      HISTORY OF PRESENT ILLNESS:  08/16/20 ALL:  She returns for migraine and IIH follow up. She continues acetazolamide 250mg  BID and Ajovy. Rizatriptan and ondansetron help with abortive therapy. She continues to have about 3-4 headache days a month but these are much less severe. She uses Ibuprofen 1-2 times a week. Eye exam normal in December. She is followed regularly by January. PCP. She has history of vitamin D def on supplements. She has changed positions at work and now with Andrea Dunlap.    08/17/19 ALL:  Andrea Dunlap is a 25 y.o. female here today for follow up for IIH and migraines. She continues Amovig monthly and Diamox 250mg  twice daily. She is doing well but has had to stop Amovig due to lack of insurance coverage. She was told that Ajovy or Emgality are preferred medications. She is having about 3 headache days per month. These are easily treated with ibuprofen. 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 She knows that increased stress is a trigger.    HISTORY: (copied from Dr Public relations account executive note on 06/10/2018)  Interval history 06/10/2018: baseline Daily headaches and 15 migraines days a month. She has 15 headache days now and 8 migraines. The migraines are moderately severe to severe. So the aimovig has helped. So has the diamox but having side effects. She takes 2 pills at night of the diamox.  She is tired int he morning but she is also eradically sleeping. She doesn't go to sleep regularly some night gets 4 hours of sleep.   Interval history 02/08/2018: She has some episodes of passing out, once she was having her hair blow dried and felt overheated, then daith piercing and getting blood drawn and passed  out. Opening pressure was normal. 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 Discussed medication and stenting options. Needs to follow closely with Dr. 14/05/2018 for vision checks. Will start Diamox. She declines any stenting procedures, will start medication management. Passing out is likely vasovagal.  CTV: reviewed images and agree:  IMPRESSION: 1. No dural venous sinus thrombosis. 2. 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 3. Unremarkable CT appearance of the brain without evidence of acute intracranial abnormality.  HPI:  Andrea Dunlap is a 25 y.o. female here as a referral from Dr. Emilie Rutter for intractable headaches. She has a PMHx of migraines. Started in the 6th grade. She has pain at the base of her neck unilaterally, light sensitivity and smell sensitivity, they spread unilaterally and cal also spread bilaterally, she feels fatigued and confused, dizzy, nausea, pressure, pounding, throbbing. Movement makes it worse. Can last 5-6 days. Daily headaches. She has 15 migraines days a month. Most are moderately severe and can have several days of severe, needs to sleep and be in a dark room, she can't work. Sleep helps. Ongoing for several years at this severity and frequency. Birth Control helped. Caffeine and ibuprofen helps.  Failed Topamax, Celebrex, Compazine, nortriptyline,. Topamax helped but had side effects to it. No medication overuse. No aura.  Patient has blurry vision especially in the left eye, she has eye pain on the left and eye pain on movement, she reports diplopia.  Headaches can be positionally worse,  worse bending over, she can have migraine headaches in the morning, episodes of blurry vision, also pulsating and hearing changes in the ears. No other focal neurologic deficits, associated symptoms, inciting events or modifiable factors.  Meds: Failed Topamax, Celebrex, Compazine, nortriptyline,. Topamax helped but had side effects to it.    Reviewed notes, labs and imaging from outside physicians, which showed:  Reviewed notes from referring physician.  Patient has trouble remembering to take her birth control and so she is getting an implant.  She has regular menstrual cycles.  She is been having migraines and worsening headaches over the last several years she reports this is affecting her vision which can get blurry birth control pills have helped but she still continues to have daily headaches she has seen a neurologist for this in the past needs a new one.  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  Physical exam was normal.  She was referred here for evaluation of these headaches.  No significant caffeine use, no drug use, no smoking.  Migraines have been worse with her menstruation.  She is seeing [facility] in the past.   REVIEW OF SYSTEMS: Out of a complete 14 system review of symptoms, the patient complains only of the following symptoms, headaches and all other reviewed systems are negative.  ALLERGIES: Allergies  Allergen Reactions  . Nickel Rash    HOME MEDICATIONS: Outpatient Medications Prior to Visit  Medication Sig Dispense Refill  . acetaZOLAMIDE (DIAMOX) 250 MG tablet Take 1 tablet (250 mg total) by mouth 2 (two) times daily. 60 tablet 11  . Fremanezumab-vfrm (AJOVY) 225 MG/1.5ML SOAJ Inject 225 mg into the skin every 30 (thirty) days. 1 pen 11  . ondansetron (ZOFRAN) 4 MG tablet Take 1 tablet (4 mg total) by mouth every 8 (eight) hours as needed for nausea or vomiting. 20 tablet 0  . rizatriptan (MAXALT-MLT) 10 MG disintegrating tablet Take 1 tablet (10 mg total) by mouth as needed for migraine. May repeat in 2 hours if needed 9 tablet 11   No facility-administered medications prior to visit.    PAST MEDICAL HISTORY: Past Medical History:  Diagnosis Date  . Migraines     PAST SURGICAL HISTORY: Past Surgical History:  Procedure Laterality Date  . LUMBAR PUNCTURE  2019  .  WISDOM TOOTH EXTRACTION      FAMILY HISTORY: Family History  Problem Relation Age of Onset  . Migraines Mother   . Headache Brother     SOCIAL HISTORY: Social History   Socioeconomic History  . Marital status: Single    Spouse name: Not on file  . Number of children: Not on file  . Years of education: last year of bachelors degree in progress  . Highest education level: Not on file  Occupational History  . Not on file  Tobacco Use  . Smoking status: Never Smoker  . Smokeless tobacco: Never Used  Vaping Use  . Vaping Use: Never used  Substance and Sexual Activity  . Alcohol use: Yes    Comment: rare   . Drug use: Never  . Sexual activity: Not on file  Other Topics Concern  . Not on file  Social History Narrative   Averages 1 cup of caffeine daily.   Left handed   Lives at home with her mother and brother   Social Determinants of Health   Financial Resource Strain: Not on file  Food Insecurity: Not on file  Transportation Needs: Not on file  Physical Activity:  Not on file  Stress: Not on file  Social Connections: Not on file  Intimate Partner Violence: Not on file      PHYSICAL EXAM  Vitals:   08/16/20 1355  BP: 113/72  Pulse: 75  Weight: 158 lb (71.7 kg)  Height: 5\' 2"  (1.575 m)   Body mass index is 28.9 kg/m.  Generalized: Well developed, in no acute distress  Cardiology: normal rate and rhythm, no murmur noted Neurological examination  Mentation: Alert oriented to time, place, history taking. Follows all commands speech and language fluent Cranial nerve II-XII: Pupils were equal round reactive to light. Extraocular movements were full, visual field were full  Motor: The motor testing reveals 5 over 5 strength of all 4 extremities. Good symmetric motor tone is noted throughout.  Sensory: Sensory testing is intact to soft touch on all 4 extremities. No evidence of extinction is noted.  Coordination: Cerebellar testing reveals good  finger-nose-finger and heel-to-shin bilaterally.  Gait and station: Gait is normal.   DIAGNOSTIC DATA (LABS, IMAGING, TESTING) - I reviewed patient records, labs, notes, testing and imaging myself where available.  No flowsheet data found.   Lab Results  Component Value Date   WBC 6.9 01/22/2018   HGB 13.8 01/22/2018   HCT 43.0 01/22/2018   MCV 90.0 01/22/2018   PLT 282 01/22/2018      Component Value Date/Time   NA 138 01/22/2018 1229   K 3.8 01/22/2018 1229   CL 101 01/22/2018 1229   CO2 27 01/22/2018 1229   GLUCOSE 110 (H) 01/22/2018 1229   BUN 7 01/22/2018 1229   CREATININE 0.66 01/22/2018 1229   CALCIUM 9.4 01/22/2018 1229   GFRNONAA >60 01/22/2018 1229   GFRAA >60 01/22/2018 1229   No results found for: CHOL, HDL, LDLCALC, LDLDIRECT, TRIG, CHOLHDL No results found for: 01/24/2018 No results found for: VITAMINB12 No results found for: TSH     ASSESSMENT AND PLAN 25 y.o. year old female  has a past medical history of Migraines. here with     ICD-10-CM   1. Intracranial hypertension  G93.2   2. Migraine without aura and without status migrainosus, not intractable  G43.009      Alie is doing very well. She will continue Diamox 250mg  twice daily and Ajovy monthly. She will continue rizatriptan and ondansetron as needed for abortive therapy. She will continue to work on healthy lifestyle habits and stay well hydrated. She will follow up in 1 year, sooner if needed.    No orders of the defined types were placed in this encounter.    Meds ordered this encounter  Medications  . acetaZOLAMIDE (DIAMOX) 250 MG tablet    Sig: Take 1 tablet (250 mg total) by mouth 2 (two) times daily.    Dispense:  180 tablet    Refill:  3    Order Specific Question:   Supervising Provider    Answer:   Colin Mulders  . rizatriptan (MAXALT-MLT) 10 MG disintegrating tablet    Sig: Take 1 tablet (10 mg total) by mouth as needed for migraine. May repeat in 2 hours if needed     Dispense:  9 tablet    Refill:  11    Order Specific Question:   Supervising Provider    Answer:   Anson Fret [id number]  . ondansetron (ZOFRAN) 4 MG tablet    Sig: Take 1 tablet (4 mg total) by mouth every 8 (eight) hours as needed for nausea or vomiting.  Dispense:  20 tablet    Refill:  0    Order Specific Question:   Supervising Provider    Answer:   Anson Fret [id number]  . Fremanezumab-vfrm (AJOVY) 225 MG/1.5ML SOAJ    Sig: Inject 225 mg into the skin every 30 (thirty) days.    Dispense:  4.5 mL    Refill:  3    Order Specific Question:   Supervising Provider    Answer:   Anson Fret [id number]      I spent 20 minutes with the patient. 50% of this time was spent counseling and educating patient on plan of care and medications.     Shawnie Dapper, FNP-C 08/16/2020, 2:21 PM [facility] [address], [address] [place], [place] [address] [phone]  Made any corrections needed, and agree with history, physical, neuro exam,assessment and plan as stated.     Naomie Dean, MD [facility]

## 2020-08-20 ENCOUNTER — Ambulatory Visit: Payer: BC Managed Care – PPO | Admitting: Family Medicine

## 2021-08-22 ENCOUNTER — Ambulatory Visit: Payer: Self-pay | Admitting: Family Medicine

## 2021-12-04 NOTE — Progress Notes (Deleted)
PATIENT: Andrea Dunlap DOB: [birth year]  REASON FOR VISIT: follow up HISTORY FROM: patient  No chief complaint on file.    HISTORY OF PRESENT ILLNESS:  12/04/21 ALL:  Andrea Dunlap returns for follow up for IIH and migraines. She continues acetazolamide and Ajovy.  Rizatriptan with ondansetron help with abortive therapy.   08/16/2020 ALL:  She returns for migraine and IIH follow up. She continues acetazolamide 250mg  BID and Ajovy. Rizatriptan and ondansetron help with abortive therapy. She continues to have about 3-4 headache days a month but these are much less severe. She uses Ibuprofen 1-2 times a week. Eye exam normal in December. She is followed regularly by Otilio Carpen. PCP. She has history of vitamin D def on supplements. She has changed positions at work and now with [employer].    08/17/19 ALL:  Andrea Dunlap is a 26 y.o. female here today for follow up for IIH and migraines. She continues Amovig monthly and Diamox 250mg  twice daily. She is doing well but has had to stop Amovig due to lack of insurance coverage. She was told that Ajovy or Emgality are preferred medications. She is having about 3 headache days per month. These are easily treated with ibuprofen. 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 She knows that increased stress is a trigger.    HISTORY: (copied from Dr Cathren Laine note on 06/10/2018)  Interval history 06/10/2018: baseline Daily headaches and 15 migraines days a month. She has 15 headache days now and 8 migraines. The migraines are moderately severe to severe. So the aimovig has helped. So has the diamox but having side effects. She takes 2 pills at night of the diamox.  She is tired int he morning but she is also eradically sleeping. She doesn't go to sleep regularly some night gets 4 hours of sleep.    Interval history 02/08/2018: She has some episodes of passing out, once she was having her hair blow dried and  felt overheated, then daith piercing and getting blood drawn and passed out. Opening pressure was normal. 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 Discussed medication and stenting options. Needs to follow closely with Dr. Katy Fitch for vision checks. Will start Diamox. She declines any stenting procedures, will start medication management. Passing out is likely vasovagal.   CTV: reviewed images and agree:   IMPRESSION: 1. No dural venous sinus thrombosis. 2. 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 3. Unremarkable CT appearance of the brain without evidence of acute intracranial abnormality.   HPI:  Andrea Dunlap is a 26 y.o. female here as a referral from Dr. Melida Quitter for intractable headaches. She has a PMHx of migraines. Started in the 6th grade. She has pain at the base of her neck unilaterally, light sensitivity and smell sensitivity, they spread unilaterally and cal also spread bilaterally, she feels fatigued and confused, dizzy, nausea, pressure, pounding, throbbing. Movement makes it worse. Can last 5-6 days. Daily headaches. She has 15 migraines days a month. Most are moderately severe and can have several days of severe, needs to sleep and be in a dark room, she can't work. Sleep helps. Ongoing for several years at this severity and frequency. Birth Control helped. Caffeine and ibuprofen helps.  Failed Topamax, Celebrex, Compazine, nortriptyline,. Topamax helped but had side effects to it. No medication overuse. No aura.  Patient has blurry vision especially in the left eye, she has eye pain on the left and eye pain on movement,  she reports diplopia.  Headaches can be positionally worse, worse bending over, she can have migraine headaches in the morning, episodes of blurry vision, also pulsating and hearing changes in the ears. No other focal neurologic deficits, associated symptoms, inciting events or modifiable factors.   Meds: Failed Topamax,  Celebrex, Compazine, nortriptyline,. Topamax helped but had side effects to it.    Reviewed notes, labs and imaging from outside physicians, which showed:   Reviewed notes from referring physician.  Patient has trouble remembering to take her birth control and so she is getting an implant.  She has regular menstrual cycles.  She is been having migraines and worsening headaches over the last several years she reports this is affecting her vision which can get blurry birth control pills have helped but she still continues to have daily headaches she has seen a neurologist for this in the past needs a new one.  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  Physical exam was normal.  She was referred here for evaluation of these headaches.  No significant caffeine use, no drug use, no smoking.  Migraines have been worse with her menstruation.  She is seeing [facility] in the past.   REVIEW OF SYSTEMS: Out of a complete 14 system review of symptoms, the patient complains only of the following symptoms, headaches and all other reviewed systems are negative.  ALLERGIES: Allergies  Allergen Reactions   Nickel Rash    HOME MEDICATIONS: Outpatient Medications Prior to Visit  Medication Sig Dispense Refill   acetaZOLAMIDE (DIAMOX) 250 MG tablet Take 1 tablet (250 mg total) by mouth 2 (two) times daily. 180 tablet 3   Fremanezumab-vfrm (AJOVY) 225 MG/1.5ML SOAJ Inject 225 mg into the skin every 30 (thirty) days. 4.5 mL 3   ondansetron (ZOFRAN) 4 MG tablet Take 1 tablet (4 mg total) by mouth every 8 (eight) hours as needed for nausea or vomiting. 20 tablet 0   rizatriptan (MAXALT-MLT) 10 MG disintegrating tablet Take 1 tablet (10 mg total) by mouth as needed for migraine. May repeat in 2 hours if needed 9 tablet 11   No facility-administered medications prior to visit.    PAST MEDICAL HISTORY: Past Medical History:  Diagnosis Date   Migraines     PAST SURGICAL HISTORY: Past  Surgical History:  Procedure Laterality Date   LUMBAR PUNCTURE  2019   WISDOM TOOTH EXTRACTION      FAMILY HISTORY: Family History  Problem Relation Age of Onset   Migraines Mother    Headache Brother     SOCIAL HISTORY: Social History   Socioeconomic History   Marital status: Single    Spouse name: Not on file   Number of children: Not on file   Years of education: last year of bachelors degree in progress   Highest education level: Not on file  Occupational History   Not on file  Tobacco Use   Smoking status: Never   Smokeless tobacco: Never  Vaping Use   Vaping Use: Never used  Substance and Sexual Activity   Alcohol use: Yes    Comment: rare    Drug use: Never   Sexual activity: Not on file  Other Topics Concern   Not on file  Social History Narrative   Averages 1 cup of caffeine daily.   Left handed   Lives at home with her mother and brother   Social Determinants of Health   Financial Resource Strain: Not on file  Food Insecurity: Not on  file  Transportation Needs: Not on file  Physical Activity: Not on file  Stress: Not on file  Social Connections: Not on file  Intimate Partner Violence: Not on file      PHYSICAL EXAM  There were no vitals filed for this visit.  There is no height or weight on file to calculate BMI.  Generalized: Well developed, in no acute distress  Cardiology: normal rate and rhythm, no murmur noted Neurological examination  Mentation: Alert oriented to time, place, history taking. Follows all commands speech and language fluent Cranial nerve II-XII: Pupils were equal round reactive to light. Extraocular movements were full, visual field were full  Motor: The motor testing reveals 5 over 5 strength of all 4 extremities. Good symmetric motor tone is noted throughout.  Sensory: Sensory testing is intact to soft touch on all 4 extremities. No evidence of extinction is noted.  Coordination: Cerebellar testing reveals good  finger-nose-finger and heel-to-shin bilaterally.  Gait and station: Gait is normal.   DIAGNOSTIC DATA (LABS, IMAGING, TESTING) - I reviewed patient records, labs, notes, testing and imaging myself where available.      View : No data to display.           Lab Results  Component Value Date   WBC 6.9 01/22/2018   HGB 13.8 01/22/2018   HCT 43.0 01/22/2018   MCV 90.0 01/22/2018   PLT 282 01/22/2018      Component Value Date/Time   NA 138 01/22/2018 1229   K 3.8 01/22/2018 1229   CL 101 01/22/2018 1229   CO2 27 01/22/2018 1229   GLUCOSE 110 (H) 01/22/2018 1229   BUN 7 01/22/2018 1229   CREATININE 0.66 01/22/2018 1229   CALCIUM 9.4 01/22/2018 1229   GFRNONAA >60 01/22/2018 1229   GFRAA >60 01/22/2018 1229   No results found for: CHOL, HDL, LDLCALC, LDLDIRECT, TRIG, CHOLHDL No results found for: HGBA1C No results found for: VITAMINB12 No results found for: TSH     ASSESSMENT AND PLAN 26 y.o. year old female  has a past medical history of Migraines. here with   No diagnosis found.    Andrea Dunlap is doing very well. She will continue Diamox 250mg  twice daily and Ajovy monthly. She will continue rizatriptan and ondansetron as needed for abortive therapy. She will continue to work on healthy lifestyle habits and stay well hydrated. She will follow up in 1 year, sooner if needed.    No orders of the defined types were placed in this encounter.    No orders of the defined types were placed in this encounter.      Debbora Presto, FNP-C 12/04/2021, 4:24 PM [facility] [address], [place], [place] [address] [phone]

## 2021-12-16 ENCOUNTER — Encounter: Payer: Self-pay | Admitting: Family Medicine

## 2021-12-16 ENCOUNTER — Ambulatory Visit: Payer: BC Managed Care – PPO | Admitting: Family Medicine

## 2021-12-16 DIAGNOSIS — G43009 Migraine without aura, not intractable, without status migrainosus: Secondary | ICD-10-CM

## 2021-12-16 DIAGNOSIS — G932 Benign intracranial hypertension: Secondary | ICD-10-CM
